# Patient Record
Sex: Female | Born: 1935 | Race: White | Hispanic: No | Smoking: Former smoker
Health system: Southern US, Community
[De-identification: ages and names within clinical notes are randomized; demographics above are authoritative.]

## PROBLEM LIST (undated history)

## (undated) DIAGNOSIS — I10 Essential (primary) hypertension: Secondary | ICD-10-CM

## (undated) DIAGNOSIS — G629 Polyneuropathy, unspecified: Secondary | ICD-10-CM

## (undated) DIAGNOSIS — M109 Gout, unspecified: Secondary | ICD-10-CM

## (undated) DIAGNOSIS — I739 Peripheral vascular disease, unspecified: Secondary | ICD-10-CM

## (undated) DIAGNOSIS — I441 Atrioventricular block, second degree: Secondary | ICD-10-CM

## (undated) DIAGNOSIS — M1990 Unspecified osteoarthritis, unspecified site: Secondary | ICD-10-CM

## (undated) DIAGNOSIS — I350 Nonrheumatic aortic (valve) stenosis: Secondary | ICD-10-CM

## (undated) DIAGNOSIS — I480 Paroxysmal atrial fibrillation: Secondary | ICD-10-CM

## (undated) DIAGNOSIS — E7800 Pure hypercholesterolemia, unspecified: Secondary | ICD-10-CM

## (undated) HISTORY — DX: Paroxysmal atrial fibrillation: I48.0

## (undated) HISTORY — PX: CATARACT EXTRACTION W/ INTRAOCULAR LENS  IMPLANT, BILATERAL: SHX1307

## (undated) HISTORY — DX: Polyneuropathy, unspecified: G62.9

## (undated) HISTORY — DX: Essential (primary) hypertension: I10

---

## 2011-02-01 ENCOUNTER — Encounter (HOSPITAL_COMMUNITY)
Admission: RE | Disposition: A | Discharge status: Home / Self Care | Payer: Self-pay | Source: Ambulatory Visit | Attending: Ophthalmology

## 2011-02-01 ENCOUNTER — Encounter (HOSPITAL_COMMUNITY): Payer: Self-pay | Admitting: *Deleted

## 2011-02-01 ENCOUNTER — Ambulatory Visit (HOSPITAL_COMMUNITY)
Admission: RE | Admit: 2011-02-01 | Discharge: 2011-02-01 | Disposition: A | Discharge status: Home / Self Care | Payer: Medicare PPO | Source: Ambulatory Visit | Attending: Ophthalmology | Admitting: Ophthalmology

## 2011-02-01 DIAGNOSIS — H26499 Other secondary cataract, unspecified eye: Secondary | ICD-10-CM | POA: Insufficient documentation

## 2011-02-01 DIAGNOSIS — E119 Type 2 diabetes mellitus without complications: Secondary | ICD-10-CM | POA: Insufficient documentation

## 2011-02-01 SURGERY — TREATMENT, USING YAG LASER
Anesthesia: LOCAL | Laterality: Left

## 2011-02-01 MED ORDER — TROPICAMIDE 1 % OP SOLN
1.0000 [drp] | OPHTHALMIC | Status: AC
  Administered 2011-02-01 (×2): 1 [drp] via OPHTHALMIC

## 2011-02-01 MED ORDER — TETRACAINE HCL 0.5 % OP SOLN
1.0000 [drp] | Freq: Once | OPHTHALMIC | Status: AC
  Administered 2011-02-01: 1 [drp] via OPHTHALMIC

## 2011-02-01 MED ORDER — APRACLONIDINE HCL 1 % OP SOLN
OPHTHALMIC | Status: AC
  Administered 2011-02-01: 1 [drp] via OPHTHALMIC
  Filled 2011-02-01: qty 0.1

## 2011-02-01 MED ORDER — TETRACAINE HCL 0.5 % OP SOLN
OPHTHALMIC | Status: AC
  Administered 2011-02-01: 1 [drp] via OPHTHALMIC
  Filled 2011-02-01: qty 2

## 2011-02-01 MED ORDER — TROPICAMIDE 1 % OP SOLN
OPHTHALMIC | Status: AC
  Administered 2011-02-01: 1 [drp] via OPHTHALMIC
  Filled 2011-02-01: qty 3

## 2011-02-01 MED ORDER — APRACLONIDINE HCL 1 % OP SOLN
1.0000 [drp] | OPHTHALMIC | Status: AC
  Administered 2011-02-01 (×2): 1 [drp] via OPHTHALMIC

## 2011-02-01 NOTE — H&P (Signed)
Pt interviewed and examined, diagnosis confirmed on slit lamp exam OS.

## 2011-02-01 NOTE — Progress Notes (Signed)
Ordered not to give 3rd drop per dr Lita Mains patients eye dilated enough.

## 2011-02-01 NOTE — Brief Op Note (Signed)
               Angela Mcdaniel 02/01/2011  [name]  Yag Laser Self Test Completedyes. Procedure: Posterior Capsulotomy, left eye.  Eye Protection Worn by Staff yes. Laser In Use Sign on Door yes.  Laser: Nd:YAG Spot Size: Fixed Burst Mode: III Power Setting: 2.o mJ/burst Position treated: n/a Number of shots: 28 Total energy delivered: 54.8 mJ    The patient tolerated the procedure without difficulty. No complications were encountered.  Tenometer reading immediately after procedure: 18 mmHg.  The patient was discharged home with the instructions to continue all her current glaucoma medications, if any.   Patient instructed to go to office at 11:15  09/25 for intraocular pressure check.  Patient verbalizes understanding of discharge instructions yes.  Nurses Notes:no complications; pt tolerated procedure well.

## 2011-02-28 HISTORY — PX: REFRACTIVE SURGERY: SHX103

## 2011-06-22 DIAGNOSIS — R011 Cardiac murmur, unspecified: Secondary | ICD-10-CM

## 2011-06-22 DIAGNOSIS — I498 Other specified cardiac arrhythmias: Secondary | ICD-10-CM

## 2011-06-24 DIAGNOSIS — R55 Syncope and collapse: Secondary | ICD-10-CM

## 2011-06-25 ENCOUNTER — Telehealth: Payer: Self-pay | Admitting: Cardiology

## 2011-06-25 NOTE — Telephone Encounter (Signed)
I was called by the monitoring company. She probably just had her monitor put on today. There was a report of a brief run of Mobitz type II. The patient was apparently asymptomatic and sleeping. I do not have the strips. This will be sent to her primary cardiologist.

## 2011-06-29 ENCOUNTER — Encounter: Payer: Self-pay | Admitting: *Deleted

## 2011-06-29 ENCOUNTER — Telehealth: Payer: Self-pay | Admitting: *Deleted

## 2011-06-29 NOTE — Telephone Encounter (Signed)
Spoke with patient to see how she is doing. Patient state's she feels fine. No c/o dizziness,sob or chest pain. Patient reminded of her appointment tomorrow morning at 10:30am.

## 2011-06-30 ENCOUNTER — Ambulatory Visit (INDEPENDENT_AMBULATORY_CARE_PROVIDER_SITE_OTHER): Payer: Medicare Other | Admitting: Cardiology

## 2011-06-30 ENCOUNTER — Telehealth: Payer: Self-pay | Admitting: *Deleted

## 2011-06-30 ENCOUNTER — Encounter: Payer: Self-pay | Admitting: Cardiology

## 2011-06-30 DIAGNOSIS — I359 Nonrheumatic aortic valve disorder, unspecified: Secondary | ICD-10-CM

## 2011-06-30 DIAGNOSIS — I498 Other specified cardiac arrhythmias: Secondary | ICD-10-CM

## 2011-06-30 DIAGNOSIS — I350 Nonrheumatic aortic (valve) stenosis: Secondary | ICD-10-CM

## 2011-06-30 DIAGNOSIS — I10 Essential (primary) hypertension: Secondary | ICD-10-CM

## 2011-06-30 DIAGNOSIS — R001 Bradycardia, unspecified: Secondary | ICD-10-CM | POA: Insufficient documentation

## 2011-06-30 DIAGNOSIS — I779 Disorder of arteries and arterioles, unspecified: Secondary | ICD-10-CM

## 2011-06-30 DIAGNOSIS — E119 Type 2 diabetes mellitus without complications: Secondary | ICD-10-CM

## 2011-06-30 DIAGNOSIS — IMO0001 Reserved for inherently not codable concepts without codable children: Secondary | ICD-10-CM

## 2011-06-30 NOTE — Telephone Encounter (Signed)
[facility]ive Admission to [facility] on 07/04/2011

## 2011-06-30 NOTE — Progress Notes (Signed)
HPI: Patient presents for post hospital followup, and to establish here in our clinic.  She was recently referred to [place] here at [facility] for evaluation of symptomatic bradycardia, most likely requiring pacemaker implantation. She was initially seen in consultation by Dr. Karren Burly, but then was transferred to our care, after she expressed a desire to be treated at [facility], if necessary. We carefully reviewed the baseline rhythm, which was notable for intermittent second degree AV type I and type II. However, we observed resolution of the arrhythmia the following day, after her nausea/vomiting resolved. We obtained a 2-D echo, which indicated normal LVF (EF 60-65%), with evidence of diastolic dysfunction, and mild aortic stenosis (mean gradient 7 mm of mercury). Carotid Dopplers were done, indicating 50-69% right ICA, and less than 50% left ICA stenosis. Of note, Dr. Andee Lineman suggested that patient might have carotid sinus hypersensitivity, by bedside testing. Orthostatic blood pressures showed no significant rise in systolic BP, but increase in pulse from 66-86, from supine to standing. TSH level was normal.  Patient presented with no known history of heart disease.  Following extensive consultation with the patient and family, plan was to observe closely as an outpatient with a continuous cardiac monitor, and early followup in our office. She presents today, as scheduled, for review of these results.  Patient has had numerous strips indicating occasional intermittent high-grade A-V block, with rates as low as 29 bpm, as well as a 3.1 second pause. The lowest rates were recorded from midafternoon to 11:00 at night. In all cases, no symptoms were reported.  Clinically, the patient denies any interim syncope or falls. She is under continuous observation by family, and presents with her daughter today. According to the daughter, the patient apparently spends most of her time sitting. Upon  standing, she does sometimes feel "wobbly", but denies any significant dizziness. Overall, she feels much better, than during her recent hospitalization. Her nausea has resolved, and she reportedly feels stronger each day. She denies chest pain, dyspnea, or palpitations.  No Known Allergies   Current Outpatient Prescriptions  Medication Sig Dispense Refill  . aspirin EC 81 MG tablet Take 81 mg by mouth daily as needed.      . fenofibrate (TRICOR) 145 MG tablet Take 145 mg by mouth daily.        . fish oil-omega-3 fatty acids 1000 MG capsule Take 2 g by mouth daily.       . insulin glargine (LANTUS) 100 UNIT/ML injection Inject 25 Units into the skin at bedtime.        . insulin lispro (HUMALOG) 100 UNIT/ML injection Inject 25 Units into the skin 3 (three) times daily before meals.        . metFORMIN (GLUCOPHAGE) 500 MG tablet Take 500 mg by mouth 2 (two) times daily with a meal.      . therapeutic multivitamin-minerals (THERAGRAN-M) tablet Take 1 tablet by mouth daily.        Marland Kitchen VITAMIN D, CHOLECALCIFEROL, PO Take 1 capsule by mouth daily.        Past Medical History  Diagnosis Date  . Type 2 diabetes mellitus   . Essential hypertension, benign   . Neuropathy     No past surgical history on file.  History   Social History  . Marital Status: Widowed    Spouse Name: N/A    Number of Children: N/A  . Years of Education: N/A   Occupational History  . Not on file.  Social History Main Topics  . Smoking status: Former Smoker -- 1.0 packs/day for 20 years    Types: Cigarettes    Quit date: 05/30/1988  . Smokeless tobacco: Never Used  . Alcohol Use: Not on file  . Drug Use: Not on file  . Sexually Active: Not on file   Other Topics Concern  . Not on file   Social History Narrative   Has 6 children whom are alive and 1 deceased.    Family History  Problem Relation Age of Onset  . Stroke Father   . Heart attack Mother   . Heart attack Brother     ROS: no nausea,  vomiting; no fever, chills; no melena, hematochezia; no claudication  PHYSICAL EXAM: BP 123/69  Pulse 67  Ht 5\' 5"  (1.651 m)  Wt 156 lb (70.761 kg)  BMI 25.96 kg/m2 GENERAL: 78 year old female, sitting upright; NAD HEENT: NCAT, PERRLA, EOMI; sclera clear; no xanthelasma NECK: palpable bilateral carotid pulses, soft bilateral bruits; no JVD; no TM LUNGS: CTA bilaterally CARDIAC: RRR (S1, S2); grade 2/6 holosystolic murmur; no rubs or gallops ABDOMEN: soft, non-tender; intact BS EXTREMETIES: no significant peripheral edema SKIN: warm/dry; no obvious rash/lesions MUSCULOSKELETAL: no joint deformity NEURO: no focal deficit; NL affect   EKG:  ASSESSMENT & PLAN:

## 2011-06-30 NOTE — Assessment & Plan Note (Addendum)
Recommendation was to admit the patient today to [facility], so as to proceed with possible permanent pacemaker implantation. However, the patient declined to do so, but has agreed to presented electively on Monday, February 4th, at [facility]. The patient was counseled regarding the risks of not being admitted today, by Dr. Andee Lineman, but nevertheless declined his recommended medical advice. Therefore, we will arrange for elective admission on February 4th, at which time she will be evaluated by our electrophysiology team for possible permanent pacemaker implantation. The patient will be maintained on current medication regimen, and is not on any rate slowing agents. CardioNet monitor will be discontinued, as of today.

## 2011-06-30 NOTE — Assessment & Plan Note (Signed)
Continue current  diabetes medication regimen, including insulin.

## 2011-06-30 NOTE — Patient Instructions (Signed)
   Admission on Monday, February 4 to [facility], someone from patient placement will be calling that morning to let you know when to arrive

## 2011-06-30 NOTE — Assessment & Plan Note (Signed)
Stable on current medication regimen 

## 2011-07-01 ENCOUNTER — Other Ambulatory Visit: Payer: Self-pay | Admitting: Physician Assistant

## 2011-07-01 DIAGNOSIS — R001 Bradycardia, unspecified: Secondary | ICD-10-CM

## 2011-07-03 NOTE — Progress Notes (Signed)
I discussed with patient need for PPM implantation. I told her we would admit her , but she wanted to wait until Monday.I  told her this was against my advice, nevertheless the patient wanted only to be admitted Monday. This conversation was witnessed by her daughter. I asked the patient to sign a form she declined acute admission

## 2011-07-04 ENCOUNTER — Inpatient Hospital Stay (HOSPITAL_COMMUNITY)
Admission: AD | Admit: 2011-07-04 | Discharge: 2011-07-06 | DRG: 244 | Disposition: A | Discharge status: Home / Self Care | Payer: Medicare Other | Source: Ambulatory Visit | Attending: Cardiology | Admitting: Cardiology

## 2011-07-04 ENCOUNTER — Encounter (HOSPITAL_COMMUNITY): Payer: Self-pay | Admitting: General Practice

## 2011-07-04 DIAGNOSIS — I6529 Occlusion and stenosis of unspecified carotid artery: Secondary | ICD-10-CM | POA: Diagnosis present

## 2011-07-04 DIAGNOSIS — Z87891 Personal history of nicotine dependence: Secondary | ICD-10-CM

## 2011-07-04 DIAGNOSIS — I498 Other specified cardiac arrhythmias: Secondary | ICD-10-CM | POA: Diagnosis present

## 2011-07-04 DIAGNOSIS — R001 Bradycardia, unspecified: Secondary | ICD-10-CM

## 2011-07-04 DIAGNOSIS — I359 Nonrheumatic aortic valve disorder, unspecified: Secondary | ICD-10-CM | POA: Diagnosis present

## 2011-07-04 DIAGNOSIS — I658 Occlusion and stenosis of other precerebral arteries: Secondary | ICD-10-CM | POA: Diagnosis present

## 2011-07-04 DIAGNOSIS — Z794 Long term (current) use of insulin: Secondary | ICD-10-CM

## 2011-07-04 DIAGNOSIS — Z7982 Long term (current) use of aspirin: Secondary | ICD-10-CM

## 2011-07-04 DIAGNOSIS — E7800 Pure hypercholesterolemia, unspecified: Secondary | ICD-10-CM | POA: Diagnosis present

## 2011-07-04 DIAGNOSIS — I441 Atrioventricular block, second degree: Principal | ICD-10-CM | POA: Diagnosis present

## 2011-07-04 DIAGNOSIS — Z8249 Family history of ischemic heart disease and other diseases of the circulatory system: Secondary | ICD-10-CM

## 2011-07-04 DIAGNOSIS — Z823 Family history of stroke: Secondary | ICD-10-CM

## 2011-07-04 DIAGNOSIS — E119 Type 2 diabetes mellitus without complications: Secondary | ICD-10-CM | POA: Diagnosis present

## 2011-07-04 DIAGNOSIS — Z79899 Other long term (current) drug therapy: Secondary | ICD-10-CM

## 2011-07-04 DIAGNOSIS — I10 Essential (primary) hypertension: Secondary | ICD-10-CM | POA: Diagnosis present

## 2011-07-04 HISTORY — DX: Peripheral vascular disease, unspecified: I73.9

## 2011-07-04 HISTORY — DX: Pure hypercholesterolemia, unspecified: E78.00

## 2011-07-04 HISTORY — DX: Unspecified osteoarthritis, unspecified site: M19.90

## 2011-07-04 HISTORY — DX: Atrioventricular block, second degree: I44.1

## 2011-07-04 HISTORY — DX: Nonrheumatic aortic (valve) stenosis: I35.0

## 2011-07-04 HISTORY — DX: Gout, unspecified: M10.9

## 2011-07-04 LAB — CBC
Hemoglobin: 11.8 g/dL — ABNORMAL LOW (ref 12.0–15.0)
MCH: 29.5 pg (ref 26.0–34.0)
MCHC: 33.1 g/dL (ref 30.0–36.0)
Platelets: 232 10*3/uL (ref 150–400)
RDW: 13.4 % (ref 11.5–15.5)

## 2011-07-04 LAB — COMPREHENSIVE METABOLIC PANEL
ALT: 15 U/L (ref 0–35)
AST: 20 U/L (ref 0–37)
Albumin: 3.5 g/dL (ref 3.5–5.2)
Alkaline Phosphatase: 62 U/L (ref 39–117)
Calcium: 9.9 mg/dL (ref 8.4–10.5)
GFR calc Af Amer: 48 mL/min — ABNORMAL LOW (ref >90)
Glucose, Bld: 95 mg/dL (ref 70–99)
Potassium: 4 mEq/L (ref 3.5–5.1)
Sodium: 138 mEq/L (ref 135–145)
Total Protein: 6.7 g/dL (ref 6.0–8.3)

## 2011-07-04 LAB — DIFFERENTIAL
Eosinophils Absolute: 0.2 10*3/uL (ref 0.0–0.7)
Eosinophils Relative: 2 % (ref 0–5)
Lymphocytes Relative: 32 % (ref 12–46)
Lymphs Abs: 2.5 10*3/uL (ref 0.7–4.0)
Monocytes Relative: 7 % (ref 3–12)

## 2011-07-04 LAB — GLUCOSE, CAPILLARY: Glucose-Capillary: 93 mg/dL (ref 70–99)

## 2011-07-04 MED ORDER — SODIUM CHLORIDE 0.9 % IV SOLN: 250.0000 mL | INTRAVENOUS | Status: DC | PRN

## 2011-07-04 MED ORDER — SODIUM CHLORIDE 0.45 % IV SOLN: INTRAVENOUS | Status: DC

## 2011-07-04 MED ORDER — SODIUM CHLORIDE 0.9 % IJ SOLN: 3.0000 mL | INTRAMUSCULAR | Status: DC | PRN

## 2011-07-04 MED ORDER — ACETAMINOPHEN 650 MG RE SUPP: 650.0000 mg | Freq: Four times a day (QID) | RECTAL | Status: DC | PRN

## 2011-07-04 MED ORDER — SODIUM CHLORIDE 0.9 % IJ SOLN
3.0000 mL | Freq: Two times a day (BID) | INTRAMUSCULAR | Status: DC
  Administered 2011-07-04: 3 mL via INTRAVENOUS

## 2011-07-04 MED ORDER — GUAIFENESIN-DM 100-10 MG/5ML PO SYRP: 15.0000 mL | ORAL_SOLUTION | ORAL | Status: DC | PRN

## 2011-07-04 MED ORDER — CHLORHEXIDINE GLUCONATE 4 % EX LIQD
60.0000 mL | Freq: Once | CUTANEOUS | Status: DC
  Filled 2011-07-04: qty 60

## 2011-07-04 MED ORDER — CHLORHEXIDINE GLUCONATE 4 % EX LIQD
60.0000 mL | Freq: Once | CUTANEOUS | Status: AC
  Administered 2011-07-04: 4 via TOPICAL
  Filled 2011-07-04: qty 60

## 2011-07-04 MED ORDER — ALUM & MAG HYDROXIDE-SIMETH 200-200-20 MG/5ML PO SUSP: 15.0000 mL | ORAL | Status: DC | PRN

## 2011-07-04 MED ORDER — SODIUM CHLORIDE 0.9 % IV SOLN
INTRAVENOUS | Status: DC
  Administered 2011-07-04 – 2011-07-05 (×2): via INTRAVENOUS

## 2011-07-04 MED ORDER — CEFAZOLIN SODIUM 1-5 GM-% IV SOLN
1.0000 g | INTRAVENOUS | Status: DC
  Filled 2011-07-04: qty 50

## 2011-07-04 MED ORDER — SODIUM CHLORIDE 0.9 % IJ SOLN: 3.0000 mL | Freq: Two times a day (BID) | INTRAMUSCULAR | Status: DC

## 2011-07-04 MED ORDER — SODIUM CHLORIDE 0.9 % IR SOLN
80.0000 mg | Status: DC
  Filled 2011-07-04 (×2): qty 2

## 2011-07-04 MED ORDER — LOPERAMIDE HCL 2 MG PO CAPS: 4.0000 mg | ORAL_CAPSULE | Freq: Once | ORAL | Status: AC | PRN

## 2011-07-04 MED ORDER — ENOXAPARIN SODIUM 40 MG/0.4ML ~~LOC~~ SOLN
40.0000 mg | SUBCUTANEOUS | Status: DC
  Filled 2011-07-04 (×2): qty 0.4

## 2011-07-04 MED ORDER — ACETAMINOPHEN 325 MG PO TABS: 650.0000 mg | ORAL_TABLET | Freq: Four times a day (QID) | ORAL | Status: DC | PRN

## 2011-07-05 ENCOUNTER — Encounter (HOSPITAL_COMMUNITY)
Admission: AD | Disposition: A | Discharge status: Home / Self Care | Payer: Self-pay | Source: Ambulatory Visit | Attending: Cardiology

## 2011-07-05 DIAGNOSIS — I441 Atrioventricular block, second degree: Secondary | ICD-10-CM

## 2011-07-05 HISTORY — PX: PACEMAKER INSERTION: SHX728

## 2011-07-05 HISTORY — PX: PERMANENT PACEMAKER INSERTION: SHX5480

## 2011-07-05 LAB — GLUCOSE, CAPILLARY
Glucose-Capillary: 232 mg/dL — ABNORMAL HIGH (ref 70–99)
Glucose-Capillary: 87 mg/dL (ref 70–99)

## 2011-07-05 SURGERY — PERMANENT PACEMAKER INSERTION
Anesthesia: LOCAL

## 2011-07-05 MED ORDER — FENTANYL CITRATE 0.05 MG/ML IJ SOLN
INTRAMUSCULAR | Status: AC
  Filled 2011-07-05: qty 2

## 2011-07-05 MED ORDER — MIDAZOLAM HCL 5 MG/5ML IJ SOLN
INTRAMUSCULAR | Status: AC
  Filled 2011-07-05: qty 5

## 2011-07-05 MED ORDER — ASPIRIN EC 81 MG PO TBEC
81.0000 mg | DELAYED_RELEASE_TABLET | Freq: Every day | ORAL | Status: DC
  Administered 2011-07-05 – 2011-07-06 (×2): 81 mg via ORAL
  Filled 2011-07-05 (×2): qty 1

## 2011-07-05 MED ORDER — LIDOCAINE HCL (PF) 1 % IJ SOLN
INTRAMUSCULAR | Status: AC
  Filled 2011-07-05: qty 60

## 2011-07-05 MED ORDER — INSULIN ASPART 100 UNIT/ML ~~LOC~~ SOLN
25.0000 [IU] | Freq: Three times a day (TID) | SUBCUTANEOUS | Status: DC
  Administered 2011-07-05 – 2011-07-06 (×3): 25 [IU] via SUBCUTANEOUS
  Filled 2011-07-05: qty 3

## 2011-07-05 MED ORDER — HEPARIN (PORCINE) IN NACL 2-0.9 UNIT/ML-% IJ SOLN
INTRAMUSCULAR | Status: AC
  Filled 2011-07-05: qty 1000

## 2011-07-05 MED ORDER — ACETAMINOPHEN 325 MG PO TABS
325.0000 mg | ORAL_TABLET | ORAL | Status: DC | PRN
  Administered 2011-07-05: 650 mg via ORAL
  Filled 2011-07-05: qty 2

## 2011-07-05 MED ORDER — INSULIN LISPRO 100 UNIT/ML ~~LOC~~ SOLN: 25.0000 [IU] | Freq: Three times a day (TID) | SUBCUTANEOUS | Status: DC

## 2011-07-05 MED ORDER — CEFAZOLIN SODIUM 1-5 GM-% IV SOLN
1.0000 g | Freq: Four times a day (QID) | INTRAVENOUS | Status: AC
  Administered 2011-07-05 – 2011-07-06 (×3): 1 g via INTRAVENOUS
  Filled 2011-07-05 (×3): qty 50

## 2011-07-05 MED ORDER — ONDANSETRON HCL 4 MG/2ML IJ SOLN: 4.0000 mg | Freq: Four times a day (QID) | INTRAMUSCULAR | Status: DC | PRN

## 2011-07-05 MED ORDER — INSULIN GLARGINE 100 UNIT/ML ~~LOC~~ SOLN
25.0000 [IU] | Freq: Every day | SUBCUTANEOUS | Status: DC
  Administered 2011-07-05: 25 [IU] via SUBCUTANEOUS
  Filled 2011-07-05: qty 3

## 2011-07-05 NOTE — Consult Note (Signed)
ELECTROPHYSIOLOGY CONSULT NOTE  Primary Care Physician: Toma Deiters, MD, MD Referring Physician:  Dr Andee Lineman  Admit Date: 07/04/2011  Reason for consultation:  Second degree AV block  Angela Mcdaniel is a 78 y.o. female with a h/o diabetes and preserved EF who is admitted from our [facility] office for further evaluation of second degree AV block.  She was recently admitted to [facility] hospital at which time she was observed to have second degree AV block.  Upon discharge, she had an event monitor placed.  This has documented mobitz II second degree AV block.  She was therefore admitted for further evaluation.   She states that recently, she has had episodes during which she felt "woozy".  She denies frank syncope but reports presyncope.  She takes no AV nodal blocking agents.  She remains active despite her age.  Today, she denies symptoms of palpitations, chest pain, shortness of breath, lower extremity edema,or neurologic sequela. The patient is tolerating medications without difficulties and is otherwise without complaint today.   Past Medical History  Diagnosis Date  . Essential hypertension, benign   . Neuropathy   . Diabetes mellitus   . High cholesterol   . Peripheral vascular disease   . Arthritis   . Gout attack     "once; several years ago"   Past Surgical History  Procedure Date  . Cataract extraction w/ intraocular lens  implant, bilateral   . Refractive surgery 02/2011    left eye       .  ceFAZolin (ANCEF) IV  1 g Intravenous On Call  . chlorhexidine  60 mL Topical Once  . chlorhexidine  60 mL Topical Once  . gentamicin irrigation  80 mg Irrigation On Call  . heparin      . lidocaine      . sodium chloride  3 mL Intravenous Q12H  . sodium chloride  3 mL Intravenous Q12H  . DISCONTD: enoxaparin  40 mg Subcutaneous Q24H      . sodium chloride 50 mL/hr at 07/05/11 7829  . DISCONTD: sodium chloride      No Known Allergies  History   Social History  . Marital  Status: Widowed    Spouse Name: N/A    Number of Children: N/A  . Years of Education: N/A   Occupational History  . Not on file.   Social History Main Topics  . Smoking status: Former Smoker -- 1.0 packs/day for 20 years    Types: Cigarettes    Quit date: 05/30/1988  . Smokeless tobacco: Never Used  . Alcohol Use: No  . Drug Use: No  . Sexually Active: No   Other Topics Concern  . Not on file   Social History Narrative   Has 6 children whom are alive and 1 deceased.    Family History  Problem Relation Age of Onset  . Stroke Father   . Heart attack Mother   . Heart attack Brother     ROS- All systems are reviewed and negative except as per the HPI above  Physical Exam: Telemetry:  Sinus rhythm with occasional mobitz II second degree AV block,  Multiple consecutive P waves not conducted with RR interval of over 3 seconds on one occasion  Filed Vitals:   07/04/11 1525 07/04/11 1748 07/04/11 2136 07/05/11 0635  BP:  159/74 135/56 127/50  Pulse: 72 86 73 71  Temp: 98.2 F (36.8 C)  98.8 F (37.1 C) 98.7 F (37.1 C)  TempSrc: Oral  Oral Oral  Resp: 18 18 18 18   Height: 5\' 5"  (1.651 m)     Weight: 160 lb 0.9 oz (72.6 kg)     SpO2: 96% 98% 95% 96%    GEN- The patient is well appearing, alert and oriented x 3 today.   Head- normocephalic, atraumatic Eyes-  Sclera clear, conjunctiva pink Ears- hearing intact Oropharynx- clear Neck- supple, no JVP Lymph- no cervical lymphadenopathy Lungs- Clear to ausculation bilaterally, normal work of breathing Heart- Regular rate and rhythm, no murmurs, rubs or gallops, PMI not laterally displaced GI- soft, NT, ND, + BS Extremities- no clubbing, cyanosis, or edema MS- no significant deformity or atrophy Skin- no rash or lesion Psych- euthymic mood, full affect Neuro- strength and sensation are intact  EKG 06/21/11- sinus rhythm 62 bpm, PR 213, QRS 94, no ischemic changes  Labs:   Lab Results  Component Value Date   WBC  7.6 07/04/2011   HGB 11.8* 07/04/2011   HCT 35.6* 07/04/2011   MCV 89.0 07/04/2011   PLT 232 07/04/2011    Lab 07/04/11 1626  NA 138  K 4.0  CL 105  CO2 25  BUN 21  CREATININE 1.16*  CALCIUM 9.9  PROT 6.7  BILITOT 0.2*  ALKPHOS 62  ALT 15  AST 20  GLUCOSE 95    ASSESSMENT AND PLAN:   1. Mobitz II second degree AV block The patient has symptomatic mobitz II second degree.  I would therefore recommend pacemaker implantation at this time.  Risks, benefits, alternatives to pacemaker implantation were discussed in detail with the patient today. The patient understands that the risks include but are not limited to bleeding, infection, pneumothorax, perforation, tamponade, vascular damage, renal failure, MI, stroke, death,  and lead dislodgement and wishes to proceed. We will therefore proceed with pacemaker implant this am.    Hillis Range, MD 07/05/2011  7:38 AM

## 2011-07-05 NOTE — Op Note (Signed)
SURGEON:  Hillis Range, MD     PREPROCEDURE DIAGNOSIS:  Mobitz II Second degree AV block    POSTPROCEDURE DIAGNOSIS:  Mobitz II Second degree AV block     PROCEDURES:   1. Left upper extremity venography.   2. Pacemaker implantation.     INTRODUCTION: Angela Mcdaniel is a 78 y.o. female  with a history of mobitz II second degree AV block who presents today for pacemaker implantation.  The patient reports intermittent episodes of dizziness over the past few weeks.  No reversible causes have been identified.  The patient therefore presents today for pacemaker implantation.     DESCRIPTION OF PROCEDURE:  Informed written consent was obtained, and the patient was brought to the electrophysiology lab in a fasting state.  The patient required no sedation for the procedure today.  The patients left chest was prepped and draped in the usual sterile fashion by the EP lab staff. The skin overlying the left deltopectoral region was infiltrated with lidocaine for local analgesia.  A 4-cm incision was made over the left deltopectoral region.  A left subcutaneous pacemaker pocket was fashioned using a combination of sharp and blunt dissection. Electrocautery was required to assure hemostasis.    Left Upper Extremity Venography: A venogram of the left upper extremity was performed, which revealed a small left cephalic vein, which emptied into a moderate left subclavian vein.  The left axillary vein was moderate in size.    RA/RV Lead Placement: The left cephalic vein was directly visualized and was too small for cannulation.  Through the left axillary vein, a [facility] model [id number] (serial number  [id number]) right atrial lead and a [facility] model 1946- [birth year] (serial number [id number]) right ventricular lead were advanced with fluoroscopic visualization into the right atrial appendage and right ventricular apex positions respectively.  Initial atrial lead P- waves measured 5 mV with impedance of 515 ohms  and a threshold of 1.4 V at 0.5 msec.  Right ventricular lead R-waves measured 9.5 mV with an impedance of 828 ohms and a threshold of 0.5 V at 0.5 msec.  Both leads were secured to the pectoralis fascia using #2-0 silk over the suture sleeves.   Device Placement:  The leads were then connected to a [employer] DR RF model [id number] (serial number  [id number] ) pacemaker.  The pocket was irrigated with copious gentamicin solution.  The pacemaker was then placed into the pocket.  The pocket was then closed in 2 layers with 2.0 Vicryl suture for the subcutaneous and subcuticular layers.  Steri-   Strips and a sterile dressing were then applied.  There were no early apparent complications.     CONCLUSIONS:   1. Successful implantation of a Industrial/product designer DR RF dual-chamber pacemaker for symptomatic mobitz II second degree AV block  2. No early apparent complications.           Hillis Range, MD 07/05/2011 9:59 AM

## 2011-07-05 NOTE — Telephone Encounter (Signed)
Notification done.

## 2011-07-06 ENCOUNTER — Inpatient Hospital Stay (HOSPITAL_COMMUNITY): Payer: Medicare Other

## 2011-07-06 ENCOUNTER — Encounter (HOSPITAL_COMMUNITY): Payer: Self-pay | Admitting: Physician Assistant

## 2011-07-06 ENCOUNTER — Other Ambulatory Visit: Payer: Self-pay

## 2011-07-06 DIAGNOSIS — I441 Atrioventricular block, second degree: Secondary | ICD-10-CM

## 2011-07-06 LAB — GLUCOSE, CAPILLARY

## 2011-07-06 NOTE — Progress Notes (Signed)
   CARE MANAGEMENT NOTE 07/06/2011  Patient:  ARABIA, NYLUND   Account Number:  [id number]  Date Initiated:  07/06/2011  Documentation initiated by:  GRAVES-BIGELOW,Shermika Balthaser  Subjective/Objective Assessment:   Pt admitted  for  Left upper extremity venography.2. Pacemaker implantation. Pt originally home with son will transition to daughters house  after d/c then transition back to sons home.     Action/Plan:   CM spoke to pt and son and pt ambulates well with cane refusing HH services at this time. CM relayed information to CC and pt plan for d/c today no services.   Anticipated DC Date:  07/06/2011   Anticipated DC Plan:  HOME/SELF CARE      DC Planning Services  CM consult      Choice offered to / List presented to:             Status of service:  Completed, signed off Medicare Important Message given?   (If response is "NO", the following Medicare IM given date fields will be blank) Date Medicare IM given:   Date Additional Medicare IM given:    Discharge Disposition:  HOME/SELF CARE  Per UR Regulation:    Comments:  07-06-11 [address], RN,BSN [phone] No other needs identified by CM at this time.

## 2011-07-06 NOTE — Discharge Summary (Signed)
Discharge Summary   Patient ID: Angela Mcdaniel MRN: [id number], DOB/AGE: January 01, 1922 78 y.o. Admit date: 07/04/2011 D/C date:     07/06/2011   Primary Discharge Diagnoses:  1. Symptomatic Mobitz II Second Degree AV Block s/p St. Jude PPM 07/05/11  Secondary Discharge Diagnoses:  1. DM 2. HTN 3. Neuropathy 4. PVD by carotid dopplers recently with 50-69% right ICA, and less than 50% left ICA stenosis 5. Mild aortic stenosis by echo recently 6. Arthritis  Hospital Course: 78 y/o F with hx of DM, HTN recently seen in the office 06/30/11 for evaluation of symptomatic bradycardia. Strips were reviewed which were notable for second degree AV type I and type II. She had had some nausea/vomiting so was observed after this had resolved. 2D echo showed normal LVF (EF 60-65%), with evidence of diastolic dysfunction, and mild aortic stenosis (mean gradient 7 mm of mercury). Carotid Dopplers were done, indicating 50-69% right ICA, and less than 50% left ICA stenosis. Of note, Dr. Andee Lineman suggested that patient might have carotid sinus hypersensitivity, by bedside testing. Orthostatic VS were negative for blood pressure change but positive with regard to HR increase from 66-86. An event monitor was placed demonstrating occasional intermittent high-grade A-V block, with rates as low as 29 bpm, as well as a 3.1 second pause. The lowest rates were recorded from midafternoon to 11:00 at night. In all cases, no symptoms were reported. She did endorse feeling wobbly upon standing at times as well as intermittently "woozy." She was on no AV nodal agents. TSH level was normal. The recommendation was to admit her from the office to [facility] with plans for ppm. However, the patient declined to do so and was counseled about the risks. She preferred elective pacemaker placement and was brought in 07/04/11 for this procedure. Telemetry was reviewed showing sinus rhythm with occasional mobitz II second degree AV block, multiple  consecutive P waves not conducted with RR interval of over 3 seconds on one occasion. She underwent implantation of a Industrial/product designer DR RF dual-chamber pacemaker for symptomatic mobitz II second degree AV block. She did well post-procedurally. F/u CXR was without complication. The family and patient respectfully declined PT eval to assess her mobility at home, stating that she has 24-hour care. In regards to diabetes, blood sugars were mostlywell controlled this admission without Metformin. Her CrCl is decreased in light of her age, so we have advised to continue to hold Metformin and talk to PCP regarding this. The patient was seen and examined today and felt stable for discharge by Dr. Johney Frame.  Discharge Vitals: Blood pressure 148/68, pulse 68, temperature 98.4 F (36.9 C), temperature source Oral, resp. rate 18, height 5\' 5"  (1.651 m), weight 160 lb 0.9 oz (72.6 kg), SpO2 93.00%.  Labs: Lab Results  Component Value Date   WBC 7.6 07/04/2011   HGB 11.8* 07/04/2011   HCT 35.6* 07/04/2011   MCV 89.0 07/04/2011   PLT 232 07/04/2011    Lab 07/04/11 1626  NA 138  K 4.0  CL 105  CO2 25  BUN 21  CREATININE 1.16*  CALCIUM 9.9  PROT 6.7  BILITOT 0.2*  ALKPHOS 62  ALT 15  AST 20  GLUCOSE 95    Diagnostic Studies/Procedures  1. Chest 2 View 07/06/2011  *RADIOLOGY REPORT*  IMPRESSION: No acute abnormalities.  Dual lead pacer in place.  Original Report Authenticated By: Gwynn Burly, M.D.    Discharge Medications   Medication List  As  of 07/06/2011 11:37 AM   STOP taking these medications         metFORMIN 500 MG tablet         TAKE these medications         aspirin EC 81 MG tablet   Take 81 mg by mouth daily.      fenofibrate 145 MG tablet   Commonly known as: TRICOR   Take 145 mg by mouth daily.      fish oil-omega-3 fatty acids 1000 MG capsule   Take 2 g by mouth daily.      insulin glargine 100 UNIT/ML injection   Commonly known as: LANTUS   Inject 25 Units into the  skin at bedtime.      insulin lispro 100 UNIT/ML injection   Commonly known as: HUMALOG   Inject 25 Units into the skin 3 (three) times daily before meals.      therapeutic multivitamin-minerals tablet   Take 1 tablet by mouth daily.      VITAMIN D (CHOLECALCIFEROL) PO   Take 1 capsule by mouth daily.            Disposition   The patient will be discharged in stable condition to home. Discharge Orders    Future Appointments: Provider: Department: Dept Phone: Center:   07/18/2011 2:30 PM Vella Kohler [facility] [phone] [facility]     Future Orders Please Complete By Expires   Diet - low sodium heart healthy      Increase activity slowly      Comments:   Please see attached sheet for instructions on wound care, activity, and bathing.     Discharge instructions      Comments:   Please follow up with primary care doctor for your diabetes. While on insulin only here, your blood sugars were fairly well controlled. We have stopped your Metformin since it is less preferred in patients with kidney insufficiency (your kidney function was slightly reduced this admission as a result of your age).     Follow-up Information    Follow up with [facility]. (07/18/11 at 2:30pm for wound check)    Contact information:   [facility] OFFICE [address] [place] [address]       Follow up with Hillis Range, MD. (Our office will call you with an appointment)    Contact information:   [place] OFF[facility]  [phone]           Duration of Discharge Encounter: Greater than 30 minutes including physician and PA time.  Signed, Ronie Spies PA-C 07/06/2011, 11:37 AM      I have seen, examined the patient, and reviewed the above assessment and plan.   Co Sign: Hillis Range, MD 07/06/2011 10:20 PM

## 2011-07-06 NOTE — Progress Notes (Addendum)
SUBJECTIVE: The patient is doing well today s/p PPM.  At this time, she denies chest pain, shortness of breath, or any new concerns.     Marland Kitchen aspirin EC  81 mg Oral Daily  .  ceFAZolin (ANCEF) IV  1 g Intravenous Q6H  . insulin aspart  25 Units Subcutaneous TID WC  . insulin glargine  25 Units Subcutaneous QHS  . DISCONTD:  ceFAZolin (ANCEF) IV  1 g Intravenous On Call  . DISCONTD: chlorhexidine  60 mL Topical Once  . DISCONTD: gentamicin irrigation  80 mg Irrigation On Call  . DISCONTD: insulin lispro  25 Units Subcutaneous TID AC  . DISCONTD: sodium chloride  3 mL Intravenous Q12H  . DISCONTD: sodium chloride  3 mL Intravenous Q12H      . DISCONTD: sodium chloride 50 mL/hr at 07/05/11 0633    OBJECTIVE: Physical Exam: Filed Vitals:   07/05/11 1300 07/05/11 1400 07/05/11 2238 07/06/11 0414  BP: 117/37 104/63 140/66 148/68  Pulse: 80 85 76 68  Temp:   97.7 F (36.5 C) 98.4 F (36.9 C)  TempSrc:   Oral Oral  Resp:   18 18  Height:      Weight:      SpO2:   94% 93%    Intake/Output Summary (Last 24 hours) at 07/06/11 0750 Last data filed at 07/05/11 2312  Gross per 24 hour  Intake    360 ml  Output    500 ml  Net   -140 ml    Telemetry reveals sinus rhythm  GEN- The patient is well appearing, alert and oriented x 3 today.   Head- normocephalic, atraumatic Eyes-  Sclera clear, conjunctiva pink Ears- hearing intact Oropharynx- clear Neck- supple, no JVP Lymph- no cervical lymphadenopathy Lungs- Clear to ausculation bilaterally, normal work of breathing Heart- Regular rate and rhythm, no murmurs, rubs or gallops, PMI not laterally displaced GI- soft, NT, ND, + BS Extremities- no clubbing, cyanosis, or edema Pacer site without hematoma  LABS: Basic Metabolic Panel:  Basename 07/04/11 1626  NA 138  K 4.0  CL 105  CO2 25  GLUCOSE 95  BUN 21  CREATININE 1.16*  CALCIUM 9.9  MG --  PHOS --   Liver Function Tests:  Basename 07/04/11 1626  AST 20  ALT 15   ALKPHOS 62  BILITOT 0.2*  PROT 6.7  ALBUMIN 3.5   No results found for this basename: LIPASE:2,AMYLASE:2 in the last 72 hours CBC:  Basename 07/04/11 1626  WBC 7.6  NEUTROABS 4.4  HGB 11.8*  HCT 35.6*  MCV 89.0  PLT 232   ASSESSMENT AND PLAN:  Active Problems:  Symptomatic bradycardia  Second degree Mobitz II AV block  1. Mobitz II second degree AV block Doing well s/p PPM CXR without pneumothorax, stable lead position  PPM interrogation reviewed and normal today  Will discharge to home today Resume home medications Routine wound care 10 days will need a wound check, follow-up with me in the [place] office in 3 months    Hillis Range, MD 07/06/2011 7:50 AM

## 2011-07-06 NOTE — Progress Notes (Signed)
PT eval was ordered. Pt and son declined eval, stating pt has 24/7 supervision at this time. No other needs identified. Pt to be discharged today. Harlow Asa

## 2011-07-07 ENCOUNTER — Encounter: Payer: Self-pay | Admitting: Cardiology

## 2011-07-18 ENCOUNTER — Ambulatory Visit (INDEPENDENT_AMBULATORY_CARE_PROVIDER_SITE_OTHER): Payer: Medicare Other | Admitting: *Deleted

## 2011-07-18 ENCOUNTER — Encounter: Payer: Self-pay | Admitting: Internal Medicine

## 2011-07-18 DIAGNOSIS — R001 Bradycardia, unspecified: Secondary | ICD-10-CM

## 2011-07-18 DIAGNOSIS — I441 Atrioventricular block, second degree: Secondary | ICD-10-CM

## 2011-07-18 DIAGNOSIS — I498 Other specified cardiac arrhythmias: Secondary | ICD-10-CM

## 2011-07-18 LAB — PACEMAKER DEVICE OBSERVATION
AL IMPEDENCE PM: 387.5 Ohm
AL THRESHOLD: 0.625 V
BAMS-0003: 70 {beats}/min
BATTERY VOLTAGE: 3.0832 V

## 2011-07-18 NOTE — Progress Notes (Signed)
Wound check pacer in clinic  

## 2011-11-02 ENCOUNTER — Ambulatory Visit (INDEPENDENT_AMBULATORY_CARE_PROVIDER_SITE_OTHER): Payer: Medicare Other | Admitting: Internal Medicine

## 2011-11-02 ENCOUNTER — Encounter: Payer: Self-pay | Admitting: Internal Medicine

## 2011-11-02 VITALS — BP 119/56 | HR 70 | Ht 65.0 in | Wt 162.0 lb

## 2011-11-02 DIAGNOSIS — I480 Paroxysmal atrial fibrillation: Secondary | ICD-10-CM | POA: Insufficient documentation

## 2011-11-02 DIAGNOSIS — I4891 Unspecified atrial fibrillation: Secondary | ICD-10-CM

## 2011-11-02 DIAGNOSIS — I441 Atrioventricular block, second degree: Secondary | ICD-10-CM

## 2011-11-02 LAB — PACEMAKER DEVICE OBSERVATION
AL AMPLITUDE: 2.6 mv
AL IMPEDENCE PM: 375 Ohm
AL THRESHOLD: 0.5 V
BAMS-0003: 70 {beats}/min
RV LEAD AMPLITUDE: 12 mv

## 2011-11-02 NOTE — Patient Instructions (Signed)
Follow up with Dr. Johney Frame in February 2014. Your physician recommends that you continue on your current medications as directed. Please refer to the Current Medication list given to you today.

## 2011-11-02 NOTE — Progress Notes (Signed)
PCP: Toma Deiters, MD, MD Primary Cardiologist:  Dr Kandis Nab is a 78 y.o. female who presents today for routine electrophysiology followup.  Since having her pacemaker implanted, the patient reports doing very well.   Her dizziness and presyncope have resolved.  Her daughter notes that her energy is also improved.  Her primary concern today is with peripheral neuropathy and unsteadiness with ambulation.   Today, she denies symptoms of palpitations, chest pain, shortness of breath,  lower extremity edema, dizziness, presyncope, or syncope.  The patient is otherwise without complaint today.   Past Medical History  Diagnosis Date  . Essential hypertension, benign   . Neuropathy   . Diabetes mellitus   . High cholesterol   . Peripheral vascular disease     by carotid dopplers recently with 50-69% right ICA, and less than 50% left ICA stenosis  . Arthritis   . Gout attack     "once; several years ago"  . Aortic stenosis     Mild aortic stenosis by echo recently  . Second degree AV block, Mobitz type II     Symptomatic s/p St. Jude PPM 07/05/11  . Paroxysmal atrial fibrillation     detected by pacemaker interrogation, longest episode is only 24 seconds   Past Surgical History  Procedure Date  . Cataract extraction w/ intraocular lens  implant, bilateral   . Refractive surgery 02/2011    left eye  . Pacemaker insertion 07/05/11    SJM Accent DR RF pacemaker implanted by Dr Johney Frame    Current Outpatient Prescriptions  Medication Sig Dispense Refill  . aspirin EC 81 MG tablet Take 81 mg by mouth daily.       . fenofibrate (TRICOR) 145 MG tablet Take 145 mg by mouth daily.       . fish oil-omega-3 fatty acids 1000 MG capsule Take 2 g by mouth daily.       Marland Kitchen glimepiride (AMARYL) 2 MG tablet Take 2 mg by mouth daily before breakfast.      . insulin glargine (LANTUS) 100 UNIT/ML injection Inject 25 Units into the skin at bedtime.        . insulin lispro (HUMALOG) 100 UNIT/ML  injection Inject 25 Units into the skin 3 (three) times daily before meals.        Marland Kitchen therapeutic multivitamin-minerals (THERAGRAN-M) tablet Take 1 tablet by mouth daily.        Marland Kitchen VITAMIN D, CHOLECALCIFEROL, PO Take 1 capsule by mouth daily.        Physical Exam: Filed Vitals:   11/02/11 1021  BP: 119/56  Pulse: 70  Height: 5\' 5"  (1.651 m)  Weight: 162 lb (73.483 kg)    GEN- The patient is well appearing, alert and oriented x 3 today.   Head- normocephalic, atraumatic Eyes-  Sclera clear, conjunctiva pink Ears- hearing intact Oropharynx- clear Lungs- Clear to ausculation bilaterally, normal work of breathing Chest- pacemaker pocket is well healed Heart- Regular rate and rhythm, no murmurs, rubs or gallops, PMI not laterally displaced GI- soft, NT, ND, + BS Extremities- no clubbing, cyanosis, or edema  Pacemaker interrogation- reviewed in detail today,  See PACEART report  Assessment and Plan:

## 2011-11-02 NOTE — Assessment & Plan Note (Signed)
Normal pacemaker function See Pace Art report No changes today  

## 2011-11-02 NOTE — Assessment & Plan Note (Signed)
Nonsustained afib detected on PPM interrogation today. Longest episode is 24 seconds. We will monitor.  If she develops significant increase in afib duration, then we will consider anticoagulation at that time.

## 2012-02-22 IMAGING — CR DG CHEST 2V
1 series · 1 of 1 positions shown · non-contrast
Comparison: None.

CLINICAL DATA: New insertion of pacemaker.

CHEST - 2 VIEW

[w chest lat]
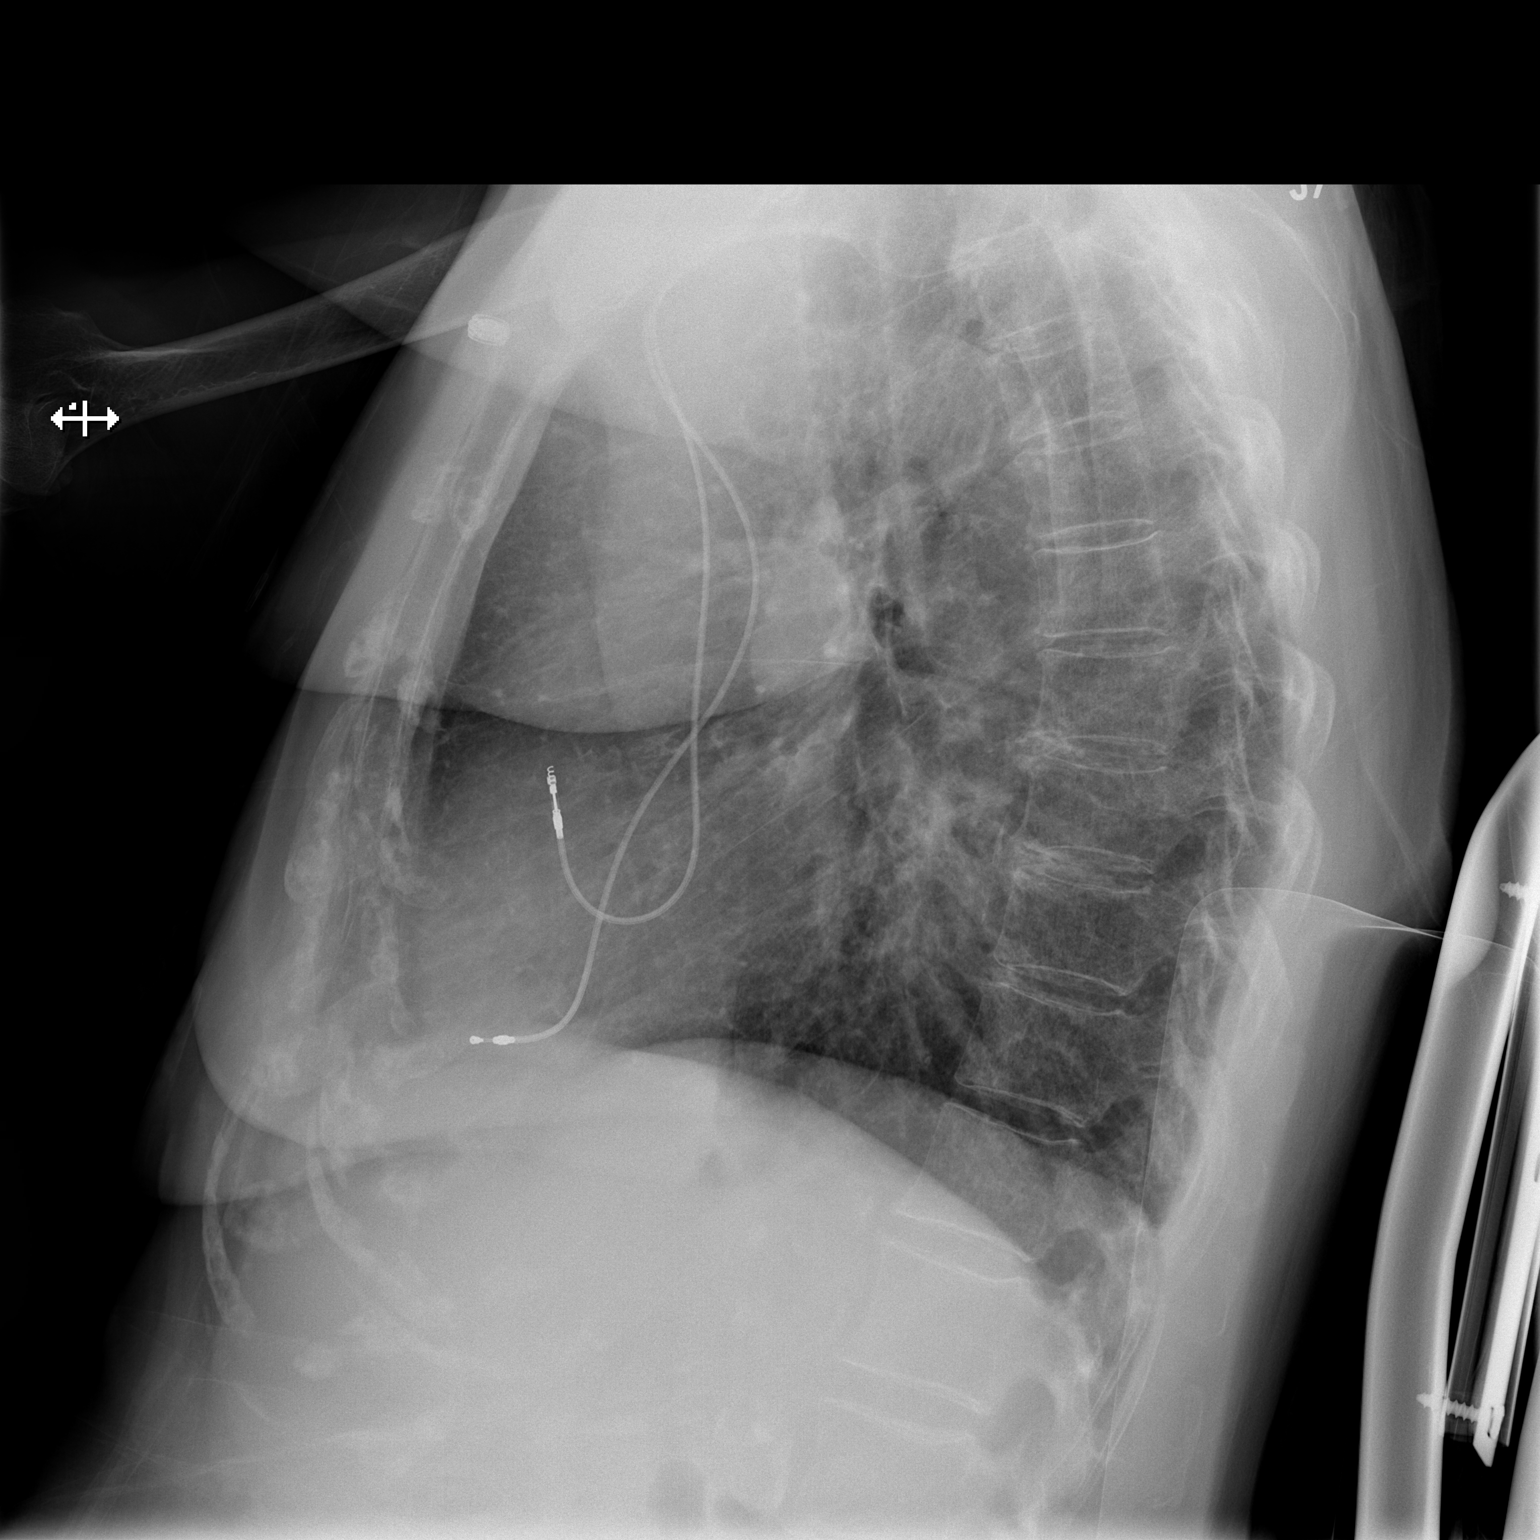

[1 of 1 positions shown; findings below may reference images not displayed]

FINDINGS: Dual lead pacemaker is in place.  No pneumothorax.  Heart
size and vascularity are normal.  Lungs are clear.  No osseous
abnormality.
IMPRESSION: No acute abnormalities.  Dual lead pacer in place.

## 2012-07-18 ENCOUNTER — Encounter: Payer: Self-pay | Admitting: Internal Medicine

## 2012-07-18 ENCOUNTER — Ambulatory Visit (INDEPENDENT_AMBULATORY_CARE_PROVIDER_SITE_OTHER): Payer: Medicare Other | Admitting: Internal Medicine

## 2012-07-18 VITALS — BP 126/67 | HR 89 | Ht 65.0 in | Wt 165.8 lb

## 2012-07-18 DIAGNOSIS — I10 Essential (primary) hypertension: Secondary | ICD-10-CM

## 2012-07-18 DIAGNOSIS — I4891 Unspecified atrial fibrillation: Secondary | ICD-10-CM

## 2012-07-18 DIAGNOSIS — R001 Bradycardia, unspecified: Secondary | ICD-10-CM

## 2012-07-18 DIAGNOSIS — I498 Other specified cardiac arrhythmias: Secondary | ICD-10-CM

## 2012-07-18 DIAGNOSIS — I441 Atrioventricular block, second degree: Secondary | ICD-10-CM

## 2012-07-18 LAB — PACEMAKER DEVICE OBSERVATION
AL IMPEDENCE PM: 375 Ohm
ATRIAL PACING PM: 12
BAMS-0003: 70 {beats}/min
BATTERY VOLTAGE: 2.9478 V
RV LEAD AMPLITUDE: 12 mv
VENTRICULAR PACING PM: 1.2

## 2012-07-18 NOTE — Assessment & Plan Note (Signed)
Stable No change required today  

## 2012-07-18 NOTE — Assessment & Plan Note (Signed)
Pacemaker interrogation reveals <1% afib, longest episode was 2hours 46 minutes. Today, I have recommended anticoagulation with coumadin or a novel anticoagulant.  The patient and her family are clear in their decision to decline anticoagulation, particularly given their concerns over her recent fall. We will discuss this again upon follow-up.  AVEROES data would support eliquis as a safe alternative to ASA for this patient.

## 2012-07-18 NOTE — Patient Instructions (Signed)
Continue all current medications. Allred - 1 year  

## 2012-07-18 NOTE — Assessment & Plan Note (Signed)
Normal pacemaker function See Pace Art report No changes today  

## 2012-07-18 NOTE — Progress Notes (Signed)
PCP: Toma Deiters, MD  Angela Mcdaniel is a 79 y.o. female who presents today for routine electrophysiology followup.  Since having her pacemaker implanted, the patient reports doing very well.   Her primary remains unsteadiness with ambulation.   She lost her balance and fell last week but did not sustain injury.  Today, she denies symptoms of palpitations, chest pain, shortness of breath,  lower extremity edema, dizziness, presyncope, or syncope.  The patient is otherwise without complaint today.   Past Medical History  Diagnosis Date  . Essential hypertension, benign   . Neuropathy   . Diabetes mellitus   . High cholesterol   . Peripheral vascular disease     by carotid dopplers recently with 50-69% right ICA, and less than 50% left ICA stenosis  . Arthritis   . Gout attack     "once; several years ago"  . Aortic stenosis     Mild aortic stenosis by echo recently  . Second degree AV block, Mobitz type II     Symptomatic s/p St. Jude PPM 07/05/11  . Paroxysmal atrial fibrillation     detected by pacemaker interrogation, longest episode is only 24 seconds   Past Surgical History  Procedure Laterality Date  . Cataract extraction w/ intraocular lens  implant, bilateral    . Refractive surgery  02/2011    left eye  . Pacemaker insertion  07/05/11    SJM Accent DR RF pacemaker implanted by Dr Johney Frame    Current Outpatient Prescriptions  Medication Sig Dispense Refill  . aspirin EC 81 MG tablet Take 81 mg by mouth daily.       . fenofibrate (TRICOR) 145 MG tablet Take 145 mg by mouth daily.       . fish oil-omega-3 fatty acids 1000 MG capsule Take 2 g by mouth daily.       Marland Kitchen glimepiride (AMARYL) 2 MG tablet Take 2 mg by mouth daily before breakfast.      . insulin glargine (LANTUS) 100 UNIT/ML injection Inject 25 Units into the skin at bedtime.        . insulin lispro (HUMALOG) 100 UNIT/ML injection Inject 25 Units into the skin 3 (three) times daily before meals.        Marland Kitchen therapeutic  multivitamin-minerals (THERAGRAN-M) tablet Take 1 tablet by mouth daily.        Marland Kitchen VITAMIN D, CHOLECALCIFEROL, PO Take 1 capsule by mouth daily.       No current facility-administered medications for this visit.    Physical Exam: Filed Vitals:   07/18/12 0836  BP: 126/67  Pulse: 89  Height: 5\' 5"  (1.651 m)  Weight: 165 lb 12.8 oz (75.206 kg)    GEN- The patient is well appearing, alert and oriented x 3 today.   Head- normocephalic, atraumatic Eyes-  Sclera clear, conjunctiva pink Ears- hearing intact Oropharynx- clear Lungs- Clear to ausculation bilaterally, normal work of breathing Chest- pacemaker pocket is well healed Heart- Regular rate and rhythm,  GI- soft, NT, ND, + BS Extremities- no clubbing, cyanosis, or edema  Pacemaker interrogation- reviewed in detail today,  See PACEART report  Assessment and Plan:

## 2012-09-12 ENCOUNTER — Telehealth: Payer: Self-pay | Admitting: Internal Medicine

## 2012-09-12 NOTE — Telephone Encounter (Signed)
PATIENT feel a few days ago. Daughter is concerned that something came lose during the fall

## 2012-09-12 NOTE — Telephone Encounter (Signed)
Spoke w/Angela Mcdaniel in regards to mother falling. Pt to send transmission this evening to make sure device is functioning properly. Will call family once transmission received.

## 2012-09-13 NOTE — Telephone Encounter (Signed)
Transmission was not received. Left message with daughter, Liborio Nixon to inquire.

## 2012-10-15 ENCOUNTER — Ambulatory Visit (INDEPENDENT_AMBULATORY_CARE_PROVIDER_SITE_OTHER): Payer: Medicare Other | Admitting: *Deleted

## 2012-10-15 DIAGNOSIS — Z950 Presence of cardiac pacemaker: Secondary | ICD-10-CM

## 2012-10-15 DIAGNOSIS — I441 Atrioventricular block, second degree: Secondary | ICD-10-CM

## 2012-10-15 LAB — REMOTE PACEMAKER DEVICE
AL AMPLITUDE: 2.9 mv
AL THRESHOLD: 0.625 V
BAMS-0001: 180 {beats}/min
DEVICE MODEL PM: 7308736
RV LEAD AMPLITUDE: 12 mv
RV LEAD IMPEDENCE PM: 610 Ohm
RV LEAD THRESHOLD: 0.75 V

## 2012-11-02 ENCOUNTER — Encounter: Payer: Self-pay | Admitting: *Deleted

## 2012-11-14 ENCOUNTER — Encounter: Payer: Self-pay | Admitting: Internal Medicine

## 2013-01-14 ENCOUNTER — Ambulatory Visit (INDEPENDENT_AMBULATORY_CARE_PROVIDER_SITE_OTHER): Payer: Medicare Other | Admitting: *Deleted

## 2013-01-14 DIAGNOSIS — I441 Atrioventricular block, second degree: Secondary | ICD-10-CM

## 2013-01-14 DIAGNOSIS — Z950 Presence of cardiac pacemaker: Secondary | ICD-10-CM

## 2013-01-15 LAB — REMOTE PACEMAKER DEVICE
AL IMPEDENCE PM: 380 Ohm
ATRIAL PACING PM: 13
BAMS-0003: 70 {beats}/min
RV LEAD IMPEDENCE PM: 600 Ohm
RV LEAD THRESHOLD: 0.625 V
VENTRICULAR PACING PM: 1

## 2013-01-30 ENCOUNTER — Encounter: Payer: Self-pay | Admitting: *Deleted

## 2013-03-05 ENCOUNTER — Encounter: Payer: Self-pay | Admitting: Internal Medicine

## 2013-04-22 ENCOUNTER — Other Ambulatory Visit: Payer: Self-pay | Admitting: Internal Medicine

## 2013-04-22 ENCOUNTER — Ambulatory Visit (INDEPENDENT_AMBULATORY_CARE_PROVIDER_SITE_OTHER): Payer: Medicare Other | Admitting: *Deleted

## 2013-04-22 DIAGNOSIS — I4891 Unspecified atrial fibrillation: Secondary | ICD-10-CM

## 2013-04-22 DIAGNOSIS — I441 Atrioventricular block, second degree: Secondary | ICD-10-CM

## 2013-04-22 DIAGNOSIS — I480 Paroxysmal atrial fibrillation: Secondary | ICD-10-CM

## 2013-04-28 LAB — MDC_IDC_ENUM_SESS_TYPE_REMOTE
Battery Remaining Longevity: 101 mo
Battery Voltage: 2.95 V
Brady Statistic AS VP Percent: 1 %
Brady Statistic RA Percent Paced: 15 %
Date Time Interrogation Session: 20141124084224
Implantable Pulse Generator Model: 2210
Implantable Pulse Generator Serial Number: 7308736
Lead Channel Impedance Value: 360 Ohm
Lead Channel Pacing Threshold Amplitude: 0.625 V
Lead Channel Pacing Threshold Pulse Width: 0.4 ms
Lead Channel Sensing Intrinsic Amplitude: 2.6 mV
Lead Channel Setting Pacing Pulse Width: 0.4 ms

## 2013-05-02 ENCOUNTER — Ambulatory Visit (INDEPENDENT_AMBULATORY_CARE_PROVIDER_SITE_OTHER): Payer: Medicare Other | Admitting: Cardiovascular Disease

## 2013-05-02 VITALS — BP 113/65 | HR 80 | Ht 65.5 in | Wt 167.0 lb

## 2013-05-02 DIAGNOSIS — I480 Paroxysmal atrial fibrillation: Secondary | ICD-10-CM

## 2013-05-02 DIAGNOSIS — Z950 Presence of cardiac pacemaker: Secondary | ICD-10-CM

## 2013-05-02 DIAGNOSIS — I441 Atrioventricular block, second degree: Secondary | ICD-10-CM

## 2013-05-02 DIAGNOSIS — I4891 Unspecified atrial fibrillation: Secondary | ICD-10-CM

## 2013-05-02 DIAGNOSIS — E785 Hyperlipidemia, unspecified: Secondary | ICD-10-CM

## 2013-05-02 DIAGNOSIS — I359 Nonrheumatic aortic valve disorder, unspecified: Secondary | ICD-10-CM

## 2013-05-02 DIAGNOSIS — Z23 Encounter for immunization: Secondary | ICD-10-CM

## 2013-05-02 DIAGNOSIS — I350 Nonrheumatic aortic (valve) stenosis: Secondary | ICD-10-CM

## 2013-05-02 DIAGNOSIS — I779 Disorder of arteries and arterioles, unspecified: Secondary | ICD-10-CM

## 2013-05-02 DIAGNOSIS — I10 Essential (primary) hypertension: Secondary | ICD-10-CM

## 2013-05-02 NOTE — Patient Instructions (Signed)
Continue all current medications. Your physician wants you to follow up in: 6 months.  You will receive a reminder letter in the mail one-two months in advance.  If you don't receive a letter, please call our office to schedule the follow up appointment   

## 2013-05-02 NOTE — Progress Notes (Signed)
Patient ID: Angela Mcdaniel, female   DOB: 1921/06/22, 79 y.o.   MRN: [id number]      SUBJECTIVE: The patient is an 79 year old woman with a past medical history significant for symptomatic second-degree A-V block status post placement of a St. Jude's pacemaker on 07/05/2011. She also has a history of hypertension, carotid artery stenosis, diabetes mellitus, hyperlipidemia, and mild aortic stenosis. She has been noted to have paroxysmal atrial fibrillation and also has a history of falls, and declined anticoagulation at her visit with Dr. Johney Frame on 07/18/2012. She is present with her daughter today and neither her daughter nor the patient recall a conversation about being on a blood thinner for atrial fibrillation. The patient says that some light and artificial light bother her, but she denies any eye pain or headaches with this. She also denies chest pain, shortness of breath, palpitations and leg swelling. She says that she gets dizzy if she stands up too quickly. She has had no recent falls. This past summer she was walking around in the middle of the night and became a little disoriented because the nightlight was off and she had a fall but did not sustain any injuries.  At her last pacemaker interrogation in November, she had 14 mode switches with the longest episode of atrial fibrillation lasting 26 seconds.  No Known Allergies  Current Outpatient Prescriptions  Medication Sig Dispense Refill  . aspirin EC 81 MG tablet Take 81 mg by mouth daily.       . fenofibrate (TRICOR) 145 MG tablet Take 145 mg by mouth daily.       . fish oil-omega-3 fatty acids 1000 MG capsule Take 2 g by mouth daily.       Marland Kitchen glimepiride (AMARYL) 2 MG tablet Take 2 mg by mouth daily before breakfast.      . insulin glargine (LANTUS) 100 UNIT/ML injection Inject 25 Units into the skin at bedtime.        . insulin lispro (HUMALOG) 100 UNIT/ML injection Inject 25 Units into the skin 3 (three) times daily before meals.         Marland Kitchen therapeutic multivitamin-minerals (THERAGRAN-M) tablet Take 1 tablet by mouth daily.        Marland Kitchen VITAMIN D, CHOLECALCIFEROL, PO Take 1 capsule by mouth daily.      Marland Kitchen atorvastatin (LIPITOR) 40 MG tablet        No current facility-administered medications for this visit.    Past Medical History  Diagnosis Date  . Essential hypertension, benign   . Neuropathy   . Diabetes mellitus   . High cholesterol   . Peripheral vascular disease     by carotid dopplers recently with 50-69% right ICA, and less than 50% left ICA stenosis  . Arthritis   . Gout attack     "once; several years ago"  . Aortic stenosis     Mild aortic stenosis by echo recently  . Second degree AV block, Mobitz type II     Symptomatic s/p St. Jude PPM 07/05/11  . Paroxysmal atrial fibrillation     detected by pacemaker interrogation    Past Surgical History  Procedure Laterality Date  . Cataract extraction w/ intraocular lens  implant, bilateral    . Refractive surgery  02/2011    left eye  . Pacemaker insertion  07/05/11    SJM Accent DR RF pacemaker implanted by Dr Johney Frame    History   Social History  . Marital Status: Widowed  Spouse Name: N/A    Number of Children: N/A  . Years of Education: N/A   Occupational History  . Not on file.   Social History Main Topics  . Smoking status: Former Smoker -- 1.00 packs/day for 20 years    Types: Cigarettes    Quit date: 05/30/1988  . Smokeless tobacco: Never Used  . Alcohol Use: No  . Drug Use: No  . Sexual Activity: No   Other Topics Concern  . Not on file   Social History Narrative   Has 6 children whom are alive and 1 deceased.     Filed Vitals:   May 20, 2013 0913  BP: 113/65  Pulse: 80  Height: 5' 5.5" (1.664 m)  Weight: 167 lb (75.751 kg)    PHYSICAL EXAM General: NAD Neck: No JVD, no thyromegaly or thyroid nodule.  Lungs: Clear to auscultation bilaterally with normal respiratory effort. CV: Nondisplaced PMI.  Heart regular S1/S2, no  S3/S4, II/VI ejection systolic murmur at RUSB.  No peripheral edema.  No carotid bruit.  Normal pedal pulses.  Abdomen: Soft, nontender, no hepatosplenomegaly, no distention.  Neurologic: Alert and oriented x 3.  Psych: Normal affect. Extremities: No clubbing or cyanosis.   ECG: reviewed and available in electronic records.      ASSESSMENT AND PLAN: 1. 2nd degree AV block s/p PPM: stable with recent interrogation in November as noted above. 2. Paroxysmal atrial fibrillation: As stated previously, the patient had 33 mode switches with the longest episode lasting 26 seconds. I had a long discussion with the patient and her daughter with regards to being on a blood thinner such as Eliquis rather than aspirin. The daughter was particularly thankful that we had this conversation as she does not remember her conversation with Dr. Johney Frame last February. She plans on discussing this further with her mother and will make a final decision at the time of her followup appointment with Dr. Johney Frame in the early part of 2015. I did explain to her the risk for stroke in the setting of paroxysmal atrial fibrillation. 3. Aortic stenosis: mild when most recently evaluated on 06/22/2011. LV systolic function was normal at that time. Continue to monitor clinically. 4. HTN: controlled without medical therapy. 5. Hyperlipidemia: currently taking Lipitor 40 mg daily. 6. Carotid artery stenosis: 50-69% RICA and less than 50% LICA on 06-22-2011.  Dispo: f/u 6 months.   Prentice Docker, M.D., F.A.C.C.

## 2013-05-07 ENCOUNTER — Encounter: Payer: Self-pay | Admitting: *Deleted

## 2013-05-27 ENCOUNTER — Encounter: Payer: Self-pay | Admitting: Internal Medicine

## 2013-09-09 ENCOUNTER — Encounter: Payer: Self-pay | Admitting: Internal Medicine

## 2013-09-09 ENCOUNTER — Ambulatory Visit (INDEPENDENT_AMBULATORY_CARE_PROVIDER_SITE_OTHER): Payer: Medicare Other | Admitting: Internal Medicine

## 2013-09-09 VITALS — BP 118/70 | HR 70 | Ht 65.0 in | Wt 164.1 lb

## 2013-09-09 DIAGNOSIS — I480 Paroxysmal atrial fibrillation: Secondary | ICD-10-CM

## 2013-09-09 DIAGNOSIS — I441 Atrioventricular block, second degree: Secondary | ICD-10-CM

## 2013-09-09 DIAGNOSIS — I10 Essential (primary) hypertension: Secondary | ICD-10-CM

## 2013-09-09 DIAGNOSIS — I4891 Unspecified atrial fibrillation: Secondary | ICD-10-CM

## 2013-09-09 LAB — MDC_IDC_ENUM_SESS_TYPE_INCLINIC
Battery Remaining Longevity: 103.2 mo
Battery Voltage: 2.95 V
Brady Statistic RV Percent Paced: 0.95 %
Implantable Pulse Generator Model: 2210
Implantable Pulse Generator Serial Number: 7308736
Lead Channel Impedance Value: 612.5 Ohm
Lead Channel Pacing Threshold Amplitude: 0.625 V
Lead Channel Pacing Threshold Amplitude: 0.75 V
Lead Channel Sensing Intrinsic Amplitude: 12 mV
Lead Channel Setting Pacing Amplitude: 2.5 V
Lead Channel Setting Pacing Pulse Width: 0.4 ms
Lead Channel Setting Sensing Sensitivity: 2 mV
MDC IDC MSMT LEADCHNL RA IMPEDANCE VALUE: 362.5 Ohm
MDC IDC MSMT LEADCHNL RA PACING THRESHOLD PULSEWIDTH: 0.4 ms
MDC IDC MSMT LEADCHNL RA SENSING INTR AMPL: 2.5 mV
MDC IDC MSMT LEADCHNL RV PACING THRESHOLD PULSEWIDTH: 0.4 ms
MDC IDC SESS DTM: 20150413102616
MDC IDC SET LEADCHNL RA PACING AMPLITUDE: 1.625
MDC IDC STAT BRADY RA PERCENT PACED: 15 %

## 2013-09-09 NOTE — Progress Notes (Signed)
PCP: Toma DeitersHASANAJ,XAJE A, MD Primary Cardiologist:  Dr Patricia PesaKoneswaran  Angela Mcdaniel is a 80 y.o. female who presents today for routine electrophysiology followup.  Since her last visit, the patient reports doing very well.   She has occasional dizziness and unsteadiness but denies recent falls.  Today, she denies symptoms of palpitations, chest pain, shortness of breath,  lower extremity edema,presyncope, or syncope.  The patient is otherwise without complaint today.   Past Medical History  Diagnosis Date  . Essential hypertension, benign   . Neuropathy   . Diabetes mellitus   . High cholesterol   . Peripheral vascular disease     by carotid dopplers recently with 50-69% right ICA, and less than 50% left ICA stenosis  . Arthritis   . Gout attack     "once; several years ago"  . Aortic stenosis     Mild aortic stenosis by echo recently  . Second degree AV block, Mobitz type II     Symptomatic s/p St. Jude PPM 07/05/11  . Paroxysmal atrial fibrillation     detected by pacemaker interrogation   Past Surgical History  Procedure Laterality Date  . Cataract extraction w/ intraocular lens  implant, bilateral    . Refractive surgery  02/2011    left eye  . Pacemaker insertion  07/05/11    SJM Accent DR RF pacemaker implanted by Dr Johney FrameAllred    Current Outpatient Prescriptions  Medication Sig Dispense Refill  . aspirin EC 81 MG tablet Take 81 mg by mouth daily.       . fenofibrate (TRICOR) 145 MG tablet Take 145 mg by mouth daily.       Marland Kitchen. glimepiride (AMARYL) 2 MG tablet Take 2 mg by mouth daily before breakfast.      . insulin glargine (LANTUS) 100 UNIT/ML injection Inject 25 Units into the skin at bedtime.        . insulin lispro (HUMALOG) 100 UNIT/ML injection Inject 25 Units into the skin 3 (three) times daily before meals.        Marland Kitchen. KRILL OIL PO Take 1 tablet by mouth 2 (two) times daily.      Marland Kitchen. therapeutic multivitamin-minerals (THERAGRAN-M) tablet Take 1 tablet by mouth daily.        Marland Kitchen. VITAMIN  D, CHOLECALCIFEROL, PO Take 1 capsule by mouth daily.       No current facility-administered medications for this visit.    Physical Exam: Filed Vitals:   09/09/13 1018  BP: 118/70  Pulse: 70  Height: 5\' 5"  (1.651 m)  Weight: 164 lb 1.9 oz (74.444 kg)  SpO2: 97%    GEN- The patient is elderly appearing, alert and oriented x 3 today.   Head- normocephalic, atraumatic Eyes-  Sclera clear, conjunctiva pink Ears- hearing intact Oropharynx- clear Lungs- Clear to ausculation bilaterally, normal work of breathing Chest- pacemaker pocket is well healed Heart- Regular rate and rhythm,  GI- soft, NT, ND, + BS Extremities- no clubbing, cyanosis, or edema  Pacemaker interrogation- reviewed in detail today,  See PACEART report  Assessment and Plan:  1. Second degree AV block Normal pacemaker function See Pace Art report No changes today  2. afib Longest episode is 43 minutes chads2vasc score is at least 6.  Dr Purvis SheffieldKoneswaran and I have both discussed anticoagulation.  She remains very clear in her decision to avoid anticoagulation at this time.  3. htn Stable No change required today  Merlin Return in 1 year to the device clinic Follow-up with  Dr Purvis SheffieldKoneswaran as scheduled

## 2013-09-09 NOTE — Patient Instructions (Signed)
Continue all current medications. Merlin remote check 12/11/2013 Your physician wants you to follow up in:  1 year.  You will receive a reminder letter in the mail one-two months in advance.  If you don't receive a letter, please call our office to schedule the follow up appointment - DR. ALLRED

## 2013-12-11 ENCOUNTER — Ambulatory Visit (INDEPENDENT_AMBULATORY_CARE_PROVIDER_SITE_OTHER): Payer: Medicare Other | Admitting: *Deleted

## 2013-12-11 DIAGNOSIS — I441 Atrioventricular block, second degree: Secondary | ICD-10-CM

## 2013-12-11 LAB — MDC_IDC_ENUM_SESS_TYPE_REMOTE
Battery Remaining Longevity: 100 mo
Battery Remaining Percentage: 77 %
Brady Statistic RA Percent Paced: 18 %
Brady Statistic RV Percent Paced: 1.4 %
Date Time Interrogation Session: 20150715060648
Implantable Pulse Generator Model: 2210
Lead Channel Impedance Value: 350 Ohm
Lead Channel Pacing Threshold Amplitude: 0.625 V
Lead Channel Pacing Threshold Pulse Width: 0.4 ms
Lead Channel Sensing Intrinsic Amplitude: 2.8 mV
Lead Channel Setting Pacing Amplitude: 1.625
Lead Channel Setting Pacing Amplitude: 2.5 V
MDC IDC MSMT BATTERY VOLTAGE: 2.95 V
MDC IDC MSMT LEADCHNL RV IMPEDANCE VALUE: 590 Ohm
MDC IDC MSMT LEADCHNL RV PACING THRESHOLD AMPLITUDE: 0.75 V
MDC IDC MSMT LEADCHNL RV PACING THRESHOLD PULSEWIDTH: 0.4 ms
MDC IDC MSMT LEADCHNL RV SENSING INTR AMPL: 9.6 mV
MDC IDC PG SERIAL: 7308736
MDC IDC SET LEADCHNL RV PACING PULSEWIDTH: 0.4 ms
MDC IDC SET LEADCHNL RV SENSING SENSITIVITY: 2 mV
MDC IDC STAT BRADY AP VP PERCENT: 1 %
MDC IDC STAT BRADY AP VS PERCENT: 20 %
MDC IDC STAT BRADY AS VP PERCENT: 1 %
MDC IDC STAT BRADY AS VS PERCENT: 79 %

## 2013-12-11 NOTE — Progress Notes (Signed)
Remote pacemaker transmission.   

## 2013-12-18 ENCOUNTER — Encounter: Payer: Self-pay | Admitting: Cardiology

## 2013-12-20 ENCOUNTER — Encounter: Payer: Self-pay | Admitting: Internal Medicine

## 2014-03-17 ENCOUNTER — Ambulatory Visit (INDEPENDENT_AMBULATORY_CARE_PROVIDER_SITE_OTHER): Payer: Medicare Other | Admitting: *Deleted

## 2014-03-17 ENCOUNTER — Encounter: Payer: Self-pay | Admitting: Internal Medicine

## 2014-03-17 DIAGNOSIS — I441 Atrioventricular block, second degree: Secondary | ICD-10-CM

## 2014-03-17 NOTE — Progress Notes (Signed)
Remote pacemaker transmission.   

## 2014-03-18 LAB — MDC_IDC_ENUM_SESS_TYPE_REMOTE
Battery Remaining Longevity: 98 mo
Battery Remaining Percentage: 76 %
Battery Voltage: 2.95 V
Brady Statistic AP VP Percent: 1 %
Brady Statistic AS VP Percent: 1 %
Brady Statistic RA Percent Paced: 18 %
Date Time Interrogation Session: 20151019063919
Implantable Pulse Generator Model: 2210
Implantable Pulse Generator Serial Number: 7308736
Lead Channel Impedance Value: 380 Ohm
Lead Channel Impedance Value: 610 Ohm
Lead Channel Pacing Threshold Amplitude: 0.625 V
Lead Channel Pacing Threshold Pulse Width: 0.4 ms
Lead Channel Sensing Intrinsic Amplitude: 4.7 mV
Lead Channel Setting Pacing Amplitude: 2.5 V
Lead Channel Setting Sensing Sensitivity: 2 mV
MDC IDC MSMT LEADCHNL RA PACING THRESHOLD PULSEWIDTH: 0.4 ms
MDC IDC MSMT LEADCHNL RV PACING THRESHOLD AMPLITUDE: 0.75 V
MDC IDC MSMT LEADCHNL RV SENSING INTR AMPL: 11.4 mV
MDC IDC SET LEADCHNL RA PACING AMPLITUDE: 1.625
MDC IDC SET LEADCHNL RV PACING PULSEWIDTH: 0.4 ms
MDC IDC STAT BRADY AP VS PERCENT: 19 %
MDC IDC STAT BRADY AS VS PERCENT: 79 %
MDC IDC STAT BRADY RV PERCENT PACED: 1.3 %

## 2014-03-27 ENCOUNTER — Encounter: Payer: Self-pay | Admitting: Cardiology

## 2014-05-08 ENCOUNTER — Encounter (HOSPITAL_COMMUNITY): Payer: Self-pay | Admitting: Internal Medicine

## 2014-06-19 ENCOUNTER — Telehealth: Payer: Self-pay | Admitting: Cardiology

## 2014-06-19 ENCOUNTER — Ambulatory Visit (INDEPENDENT_AMBULATORY_CARE_PROVIDER_SITE_OTHER): Payer: Medicare Other | Admitting: *Deleted

## 2014-06-19 DIAGNOSIS — I441 Atrioventricular block, second degree: Secondary | ICD-10-CM

## 2014-06-19 NOTE — Telephone Encounter (Signed)
Attempted to confirm remote transmission with pt. No answer and was unable to leave a message.   

## 2014-06-21 ENCOUNTER — Encounter: Payer: Self-pay | Admitting: Internal Medicine

## 2014-06-21 DIAGNOSIS — I441 Atrioventricular block, second degree: Secondary | ICD-10-CM

## 2014-06-23 NOTE — Progress Notes (Signed)
Remote pacemaker transmission.   

## 2014-06-26 LAB — MDC_IDC_ENUM_SESS_TYPE_REMOTE
Battery Remaining Longevity: 97 mo
Battery Remaining Percentage: 74 %
Brady Statistic AP VP Percent: 1 %
Brady Statistic AS VP Percent: 1 %
Brady Statistic AS VS Percent: 80 %
Brady Statistic RA Percent Paced: 17 %
Brady Statistic RV Percent Paced: 1.4 %
Implantable Pulse Generator Model: 2210
Implantable Pulse Generator Serial Number: 7308736
Lead Channel Impedance Value: 360 Ohm
Lead Channel Impedance Value: 590 Ohm
Lead Channel Pacing Threshold Amplitude: 0.625 V
Lead Channel Setting Pacing Amplitude: 1.625
Lead Channel Setting Pacing Amplitude: 2.5 V
Lead Channel Setting Sensing Sensitivity: 2 mV
MDC IDC MSMT BATTERY VOLTAGE: 2.95 V
MDC IDC MSMT LEADCHNL RA PACING THRESHOLD PULSEWIDTH: 0.4 ms
MDC IDC MSMT LEADCHNL RA SENSING INTR AMPL: 3.6 mV
MDC IDC MSMT LEADCHNL RV SENSING INTR AMPL: 12 mV
MDC IDC SESS DTM: 20160123071206
MDC IDC SET LEADCHNL RV PACING PULSEWIDTH: 0.4 ms
MDC IDC STAT BRADY AP VS PERCENT: 19 %

## 2014-07-02 ENCOUNTER — Encounter: Payer: Self-pay | Admitting: Cardiology

## 2014-10-10 ENCOUNTER — Encounter: Payer: Self-pay | Admitting: Internal Medicine

## 2014-10-10 ENCOUNTER — Ambulatory Visit (INDEPENDENT_AMBULATORY_CARE_PROVIDER_SITE_OTHER): Payer: Medicare Other | Admitting: Internal Medicine

## 2014-10-10 VITALS — BP 132/64 | HR 67 | Ht 65.0 in | Wt 168.0 lb

## 2014-10-10 DIAGNOSIS — R001 Bradycardia, unspecified: Secondary | ICD-10-CM | POA: Diagnosis not present

## 2014-10-10 DIAGNOSIS — I441 Atrioventricular block, second degree: Secondary | ICD-10-CM | POA: Diagnosis not present

## 2014-10-10 DIAGNOSIS — I480 Paroxysmal atrial fibrillation: Secondary | ICD-10-CM

## 2014-10-10 LAB — CUP PACEART INCLINIC DEVICE CHECK
Battery Voltage: 2.93 V
Brady Statistic RV Percent Paced: 1.6 %
Date Time Interrogation Session: 20160513135739
Lead Channel Pacing Threshold Amplitude: 0.625 V
Lead Channel Pacing Threshold Amplitude: 0.75 V
Lead Channel Pacing Threshold Pulse Width: 0.4 ms
Lead Channel Pacing Threshold Pulse Width: 0.4 ms
Lead Channel Sensing Intrinsic Amplitude: 12 mV
Lead Channel Sensing Intrinsic Amplitude: 2.7 mV
Lead Channel Setting Pacing Amplitude: 2.5 V
Lead Channel Setting Pacing Pulse Width: 0.4 ms
Lead Channel Setting Sensing Sensitivity: 2 mV
MDC IDC MSMT BATTERY REMAINING LONGEVITY: 112.8 mo
MDC IDC MSMT LEADCHNL RA IMPEDANCE VALUE: 362.5 Ohm
MDC IDC MSMT LEADCHNL RV IMPEDANCE VALUE: 575 Ohm
MDC IDC PG SERIAL: 7308736
MDC IDC SET LEADCHNL RA PACING AMPLITUDE: 1.625
MDC IDC STAT BRADY RA PERCENT PACED: 17 %
Pulse Gen Model: 2210

## 2014-10-10 NOTE — Patient Instructions (Signed)
Your physician recommends that you continue on your current medications as directed. Please refer to the Current Medication list given to you today. Merlin device check on 01/12/15. Your physician recommends that you schedule a follow-up appointment in: 1 year. You will receive a reminder letter in the mail in about 10 months reminding you to call and schedule your appointment. If you don't receive this letter, please contact our office.

## 2014-10-10 NOTE — Progress Notes (Signed)
PCP: Toma DeitersHASANAJ,XAJE A, MD Primary Cardiologist:  Dr Patricia PesaKoneswaran  Angela Mcdaniel is a 81 y.o. female who presents today for routine electrophysiology followup.  Since her last visit, the patient reports doing very well.   She has occasional dizziness and unsteadiness but denies recent falls.  Today, she denies symptoms of palpitations, chest pain, shortness of breath,  lower extremity edema,presyncope, or syncope.  The patient is otherwise without complaint today.   Past Medical History  Diagnosis Date  . Essential hypertension, benign   . Neuropathy   . Diabetes mellitus   . High cholesterol   . Peripheral vascular disease     by carotid dopplers recently with 50-69% right ICA, and less than 50% left ICA stenosis  . Arthritis   . Gout attack     "once; several years ago"  . Aortic stenosis     Mild aortic stenosis by echo recently  . Second degree AV block, Mobitz type II     Symptomatic s/p St. Jude PPM 07/05/11  . Paroxysmal atrial fibrillation     detected by pacemaker interrogation   Past Surgical History  Procedure Laterality Date  . Cataract extraction w/ intraocular lens  implant, bilateral    . Refractive surgery  02/2011    left eye  . Pacemaker insertion  07/05/11    SJM Accent DR RF pacemaker implanted by Dr Johney FrameAllred  . Permanent pacemaker insertion N/A 07/05/2011    Procedure: PERMANENT PACEMAKER INSERTION;  Surgeon: Hillis RangeJames Chizuko Trine, MD;  [facility];  Service: Cardiovascular;  Laterality: N/A;    Current Outpatient Prescriptions  Medication Sig Dispense Refill  . aspirin EC 81 MG tablet Take 81 mg by mouth daily.     . fenofibrate (TRICOR) 145 MG tablet Take 145 mg by mouth daily.     Marland Kitchen. glimepiride (AMARYL) 2 MG tablet Take 2 mg by mouth daily before breakfast.    . insulin glargine (LANTUS) 100 UNIT/ML injection Inject 25 Units into the skin at bedtime.      . insulin lispro (HUMALOG) 100 UNIT/ML injection Inject 25 Units into the skin 3 (three) times daily before  meals.      Marland Kitchen. KRILL OIL PO Take 1 tablet by mouth 2 (two) times daily.    Marland Kitchen. therapeutic multivitamin-minerals (THERAGRAN-M) tablet Take 1 tablet by mouth daily.      Marland Kitchen. VITAMIN D, CHOLECALCIFEROL, PO Take 1 capsule by mouth daily.     No current facility-administered medications for this visit.    Physical Exam: Filed Vitals:   10/10/14 1213  BP: 132/64  Pulse: 67  Height: 5\' 5"  (1.651 m)  Weight: 168 lb (76.204 kg)  SpO2: 96%    GEN- The patient is elderly appearing, alert and oriented x 3 today.   Head- normocephalic, atraumatic Eyes-  Sclera clear, conjunctiva pink Ears- hearing intact Oropharynx- clear Lungs- Clear to ausculation bilaterally, normal work of breathing Chest- pacemaker pocket is well healed Heart- Regular rate and rhythm,  GI- soft, NT, ND, + BS Extremities- no clubbing, cyanosis, or edema  Pacemaker interrogation- reviewed in detail today,  See PACEART report  Assessment and Plan:  1. Second degree AV block Normal pacemaker function See Pace Art report No changes today  2. afib chads2vasc score is at least 6.  She remains very clear in her decision to avoid anticoagulation at this time.  3. htn Stable No change required today  Merlin Return in 1 year to the device clinic Follow-up with Dr Purvis SheffieldKoneswaran  as scheduled

## 2015-01-12 ENCOUNTER — Encounter: Payer: Self-pay | Admitting: Internal Medicine

## 2015-01-12 ENCOUNTER — Ambulatory Visit (INDEPENDENT_AMBULATORY_CARE_PROVIDER_SITE_OTHER): Payer: Medicare Other | Admitting: *Deleted

## 2015-01-12 DIAGNOSIS — I441 Atrioventricular block, second degree: Secondary | ICD-10-CM

## 2015-01-12 NOTE — Progress Notes (Signed)
Remote pacemaker transmission.   

## 2015-01-15 LAB — CUP PACEART REMOTE DEVICE CHECK
Battery Remaining Longevity: 105 mo
Battery Voltage: 2.93 V
Brady Statistic AP VS Percent: 21 %
Brady Statistic AS VS Percent: 77 %
Brady Statistic RV Percent Paced: 1.9 %
Date Time Interrogation Session: 20160815062734
Lead Channel Impedance Value: 380 Ohm
Lead Channel Pacing Threshold Amplitude: 0.625 V
Lead Channel Pacing Threshold Amplitude: 0.75 V
Lead Channel Pacing Threshold Pulse Width: 0.4 ms
Lead Channel Sensing Intrinsic Amplitude: 3.9 mV
Lead Channel Setting Pacing Amplitude: 1.625
Lead Channel Setting Pacing Pulse Width: 0.4 ms
MDC IDC MSMT BATTERY REMAINING PERCENTAGE: 81 %
MDC IDC MSMT LEADCHNL RV IMPEDANCE VALUE: 610 Ohm
MDC IDC MSMT LEADCHNL RV PACING THRESHOLD PULSEWIDTH: 0.4 ms
MDC IDC MSMT LEADCHNL RV SENSING INTR AMPL: 12 mV
MDC IDC SET LEADCHNL RV PACING AMPLITUDE: 2.5 V
MDC IDC SET LEADCHNL RV SENSING SENSITIVITY: 2 mV
MDC IDC STAT BRADY AP VP PERCENT: 1 %
MDC IDC STAT BRADY AS VP PERCENT: 1.3 %
MDC IDC STAT BRADY RA PERCENT PACED: 20 %
Pulse Gen Model: 2210
Pulse Gen Serial Number: 7308736

## 2015-01-20 ENCOUNTER — Encounter: Payer: Self-pay | Admitting: Cardiology

## 2015-04-13 ENCOUNTER — Ambulatory Visit (INDEPENDENT_AMBULATORY_CARE_PROVIDER_SITE_OTHER): Payer: Medicare Other | Admitting: *Deleted

## 2015-04-13 DIAGNOSIS — I441 Atrioventricular block, second degree: Secondary | ICD-10-CM

## 2015-04-13 NOTE — Progress Notes (Signed)
Remote pacemaker transmission.   

## 2015-04-19 ENCOUNTER — Encounter: Payer: Self-pay | Admitting: Internal Medicine

## 2015-04-22 LAB — CUP PACEART REMOTE DEVICE CHECK
Battery Remaining Longevity: 104 mo
Battery Remaining Percentage: 81 %
Brady Statistic AP VS Percent: 19 %
Brady Statistic AS VP Percent: 1.2 %
Brady Statistic AS VS Percent: 79 %
Brady Statistic RA Percent Paced: 17 %
Brady Statistic RV Percent Paced: 2.1 %
Date Time Interrogation Session: 20161114085848
Implantable Lead Implant Date: 20130205
Implantable Lead Location: 753859
Implantable Lead Model: 1948
Lead Channel Impedance Value: 360 Ohm
Lead Channel Pacing Threshold Amplitude: 0.625 V
Lead Channel Pacing Threshold Pulse Width: 0.4 ms
Lead Channel Pacing Threshold Pulse Width: 0.4 ms
Lead Channel Sensing Intrinsic Amplitude: 12 mV
Lead Channel Sensing Intrinsic Amplitude: 3.6 mV
Lead Channel Setting Sensing Sensitivity: 2 mV
MDC IDC LEAD IMPLANT DT: 20130205
MDC IDC LEAD LOCATION: 753860
MDC IDC MSMT BATTERY VOLTAGE: 2.93 V
MDC IDC MSMT LEADCHNL RV IMPEDANCE VALUE: 590 Ohm
MDC IDC MSMT LEADCHNL RV PACING THRESHOLD AMPLITUDE: 0.75 V
MDC IDC SET LEADCHNL RA PACING AMPLITUDE: 1.625
MDC IDC SET LEADCHNL RV PACING AMPLITUDE: 2.5 V
MDC IDC SET LEADCHNL RV PACING PULSEWIDTH: 0.4 ms
MDC IDC STAT BRADY AP VP PERCENT: 1 %
Pulse Gen Serial Number: 7308736

## 2015-04-29 ENCOUNTER — Encounter: Payer: Self-pay | Admitting: Cardiology

## 2015-06-29 DIAGNOSIS — Z1389 Encounter for screening for other disorder: Secondary | ICD-10-CM | POA: Diagnosis not present

## 2015-06-29 DIAGNOSIS — I10 Essential (primary) hypertension: Secondary | ICD-10-CM | POA: Diagnosis not present

## 2015-06-29 DIAGNOSIS — Z0000 Encounter for general adult medical examination without abnormal findings: Secondary | ICD-10-CM | POA: Diagnosis not present

## 2015-06-29 DIAGNOSIS — E1122 Type 2 diabetes mellitus with diabetic chronic kidney disease: Secondary | ICD-10-CM | POA: Diagnosis not present

## 2015-07-13 ENCOUNTER — Ambulatory Visit (INDEPENDENT_AMBULATORY_CARE_PROVIDER_SITE_OTHER): Payer: Medicare Other | Admitting: *Deleted

## 2015-07-13 DIAGNOSIS — I441 Atrioventricular block, second degree: Secondary | ICD-10-CM

## 2015-07-13 NOTE — Progress Notes (Signed)
Remote pacemaker transmission.   

## 2015-07-15 ENCOUNTER — Encounter: Payer: Self-pay | Admitting: Cardiology

## 2015-07-15 LAB — CUP PACEART REMOTE DEVICE CHECK
Battery Voltage: 2.95 V
Brady Statistic AP VP Percent: 1 %
Brady Statistic AP VS Percent: 18 %
Brady Statistic AS VP Percent: 1.2 %
Brady Statistic AS VS Percent: 80 %
Brady Statistic RA Percent Paced: 16 %
Date Time Interrogation Session: 20170213073015
Implantable Lead Implant Date: 20130205
Implantable Lead Location: 753860
Implantable Lead Model: 1948
Lead Channel Impedance Value: 350 Ohm
Lead Channel Pacing Threshold Pulse Width: 0.4 ms
Lead Channel Sensing Intrinsic Amplitude: 11 mV
Lead Channel Setting Pacing Amplitude: 2.5 V
Lead Channel Setting Pacing Pulse Width: 0.4 ms
Lead Channel Setting Sensing Sensitivity: 2 mV
MDC IDC LEAD IMPLANT DT: 20130205
MDC IDC LEAD LOCATION: 753859
MDC IDC MSMT BATTERY REMAINING LONGEVITY: 116 mo
MDC IDC MSMT BATTERY REMAINING PERCENTAGE: 91 %
MDC IDC MSMT LEADCHNL RA PACING THRESHOLD AMPLITUDE: 0.625 V
MDC IDC MSMT LEADCHNL RA SENSING INTR AMPL: 3.1 mV
MDC IDC MSMT LEADCHNL RV IMPEDANCE VALUE: 590 Ohm
MDC IDC SET LEADCHNL RA PACING AMPLITUDE: 1.625
MDC IDC STAT BRADY RV PERCENT PACED: 2.1 %
Pulse Gen Model: 2210
Pulse Gen Serial Number: 7308736

## 2015-09-29 DIAGNOSIS — M79641 Pain in right hand: Secondary | ICD-10-CM | POA: Diagnosis not present

## 2015-09-29 DIAGNOSIS — T148 Other injury of unspecified body region: Secondary | ICD-10-CM | POA: Diagnosis not present

## 2015-09-29 DIAGNOSIS — M25511 Pain in right shoulder: Secondary | ICD-10-CM | POA: Diagnosis not present

## 2015-09-29 DIAGNOSIS — S6991XA Unspecified injury of right wrist, hand and finger(s), initial encounter: Secondary | ICD-10-CM | POA: Diagnosis not present

## 2015-09-29 DIAGNOSIS — S66911A Strain of unspecified muscle, fascia and tendon at wrist and hand level, right hand, initial encounter: Secondary | ICD-10-CM | POA: Diagnosis not present

## 2015-09-29 DIAGNOSIS — Z87891 Personal history of nicotine dependence: Secondary | ICD-10-CM | POA: Diagnosis not present

## 2015-09-29 DIAGNOSIS — S199XXA Unspecified injury of neck, initial encounter: Secondary | ICD-10-CM | POA: Diagnosis not present

## 2015-09-29 DIAGNOSIS — M79601 Pain in right arm: Secondary | ICD-10-CM | POA: Diagnosis not present

## 2015-09-29 DIAGNOSIS — E119 Type 2 diabetes mellitus without complications: Secondary | ICD-10-CM | POA: Diagnosis not present

## 2015-09-29 DIAGNOSIS — S022XXA Fracture of nasal bones, initial encounter for closed fracture: Secondary | ICD-10-CM | POA: Diagnosis not present

## 2015-09-29 DIAGNOSIS — M79602 Pain in left arm: Secondary | ICD-10-CM | POA: Diagnosis not present

## 2015-09-29 DIAGNOSIS — S66912A Strain of unspecified muscle, fascia and tendon at wrist and hand level, left hand, initial encounter: Secondary | ICD-10-CM | POA: Diagnosis not present

## 2015-09-29 DIAGNOSIS — M25531 Pain in right wrist: Secondary | ICD-10-CM | POA: Diagnosis not present

## 2015-09-29 DIAGNOSIS — S0990XA Unspecified injury of head, initial encounter: Secondary | ICD-10-CM | POA: Diagnosis not present

## 2015-09-29 DIAGNOSIS — S0083XA Contusion of other part of head, initial encounter: Secondary | ICD-10-CM | POA: Diagnosis not present

## 2015-09-29 DIAGNOSIS — S43401A Unspecified sprain of right shoulder joint, initial encounter: Secondary | ICD-10-CM | POA: Diagnosis not present

## 2015-09-29 DIAGNOSIS — M79642 Pain in left hand: Secondary | ICD-10-CM | POA: Diagnosis not present

## 2015-09-29 DIAGNOSIS — M25552 Pain in left hip: Secondary | ICD-10-CM | POA: Diagnosis not present

## 2015-10-02 DIAGNOSIS — Z0000 Encounter for general adult medical examination without abnormal findings: Secondary | ICD-10-CM | POA: Diagnosis not present

## 2015-10-02 DIAGNOSIS — I10 Essential (primary) hypertension: Secondary | ICD-10-CM | POA: Diagnosis not present

## 2015-10-02 DIAGNOSIS — E1122 Type 2 diabetes mellitus with diabetic chronic kidney disease: Secondary | ICD-10-CM | POA: Diagnosis not present

## 2015-10-02 DIAGNOSIS — M950 Acquired deformity of nose: Secondary | ICD-10-CM | POA: Diagnosis not present

## 2015-12-04 ENCOUNTER — Encounter: Payer: Medicare Other | Admitting: Internal Medicine

## 2015-12-25 ENCOUNTER — Ambulatory Visit (INDEPENDENT_AMBULATORY_CARE_PROVIDER_SITE_OTHER): Payer: Medicare Other | Admitting: Internal Medicine

## 2015-12-25 ENCOUNTER — Encounter: Payer: Self-pay | Admitting: Internal Medicine

## 2015-12-25 DIAGNOSIS — I441 Atrioventricular block, second degree: Secondary | ICD-10-CM

## 2015-12-25 DIAGNOSIS — I480 Paroxysmal atrial fibrillation: Secondary | ICD-10-CM | POA: Diagnosis not present

## 2015-12-25 DIAGNOSIS — R001 Bradycardia, unspecified: Secondary | ICD-10-CM | POA: Diagnosis not present

## 2015-12-25 LAB — CUP PACEART INCLINIC DEVICE CHECK
Battery Remaining Longevity: 112.8
Date Time Interrogation Session: 20170728142925
Implantable Lead Implant Date: 20130205
Implantable Lead Implant Date: 20130205
Implantable Lead Location: 753859
Implantable Lead Location: 753860
Lead Channel Impedance Value: 362.5 Ohm
Lead Channel Impedance Value: 650 Ohm
Lead Channel Pacing Threshold Amplitude: 0.75 V
Lead Channel Pacing Threshold Pulse Width: 0.4 ms
Lead Channel Pacing Threshold Pulse Width: 0.4 ms
Lead Channel Sensing Intrinsic Amplitude: 12 mV
Lead Channel Setting Pacing Amplitude: 1.625
Lead Channel Setting Pacing Pulse Width: 0.4 ms
Lead Channel Setting Sensing Sensitivity: 2 mV
MDC IDC LEAD MODEL: 1948
MDC IDC MSMT BATTERY VOLTAGE: 2.93 V
MDC IDC MSMT LEADCHNL RA PACING THRESHOLD AMPLITUDE: 0.625 V
MDC IDC MSMT LEADCHNL RA SENSING INTR AMPL: 3.3 mV
MDC IDC SET LEADCHNL RV PACING AMPLITUDE: 2.5 V
MDC IDC STAT BRADY RA PERCENT PACED: 17 %
MDC IDC STAT BRADY RV PERCENT PACED: 1.9 %
Pulse Gen Serial Number: 7308736

## 2015-12-25 NOTE — Progress Notes (Signed)
PCP: Toma Deiters, MD Primary Cardiologist:  Dr Patricia Pesa is a 82 y.o. female who presents today for routine electrophysiology followup.  Since her last visit, the patient reports doing very well.   She sleeps "all the time".   Today, she denies symptoms of palpitations, chest pain, shortness of breath,  lower extremity edema,presyncope, or syncope.  The patient is otherwise without complaint today.   Past Medical History:  Diagnosis Date  . Aortic stenosis    Mild aortic stenosis by echo recently  . Arthritis   . Diabetes mellitus   . Essential hypertension, benign   . Gout attack    "once; several years ago"  . High cholesterol   . Neuropathy (HCC)   . Paroxysmal atrial fibrillation (HCC)    detected by pacemaker interrogation  . Peripheral vascular disease (HCC)    by carotid dopplers recently with 50-69% right ICA, and less than 50% left ICA stenosis  . Second degree AV block, Mobitz type II    Symptomatic s/p St. Jude PPM 07/05/11   Past Surgical History:  Procedure Laterality Date  . CATARACT EXTRACTION W/ INTRAOCULAR LENS  IMPLANT, BILATERAL    . PACEMAKER INSERTION  07/05/11   SJM Accent DR RF pacemaker implanted by Dr Johney Frame  . PERMANENT PACEMAKER INSERTION N/A 07/05/2011   Procedure: PERMANENT PACEMAKER INSERTION;  Surgeon: Hillis Range, MD;  Location: [facility] CATH LAB;  Service: Cardiovascular;  Laterality: N/A;  . REFRACTIVE SURGERY  02/2011   left eye    Current Outpatient Prescriptions  Medication Sig Dispense Refill  . aspirin EC 81 MG tablet Take 81 mg by mouth daily.     . fenofibrate (TRICOR) 145 MG tablet Take 145 mg by mouth daily.     Marland Kitchen glimepiride (AMARYL) 2 MG tablet Take 2 mg by mouth daily before breakfast.    . insulin glargine (LANTUS) 100 UNIT/ML injection Inject 25 Units into the skin at bedtime.      . insulin lispro (HUMALOG) 100 UNIT/ML injection Inject 25 Units into the skin 3 (three) times daily before meals.      Marland Kitchen KRILL OIL PO  Take 1 tablet by mouth 2 (two) times daily.    Marland Kitchen therapeutic multivitamin-minerals (THERAGRAN-M) tablet Take 1 tablet by mouth daily.      Marland Kitchen VITAMIN D, CHOLECALCIFEROL, PO Take 1 capsule by mouth daily.     No current facility-administered medications for this visit.     Physical Exam: Vitals:   12/25/15 1022  BP: 128/64  Pulse: 68  SpO2: 95%  Weight: 163 lb (73.9 kg)  Height:  (1.651 m)    GEN- The patient is elderly appearing, alert and oriented x 3 today.   Head- normocephalic, atraumatic Eyes-  Sclera clear, conjunctiva pink Ears- very poor hearing Oropharynx- clear Lungs- Clear to ausculation bilaterally, normal work of breathing Chest- pacemaker pocket is well healed Heart- Regular rate and rhythm,  GI- soft, NT, ND, + BS Extremities- no clubbing, cyanosis, or edema  Pacemaker interrogation- reviewed in detail today,  See PACEART report  Assessment and Plan:  1. Second degree AV block Normal pacemaker function See Pace Art report No changes today  2. afib chads2vasc score is at least 6.  She remains very clear in her decision to avoid anticoagulation at this time.  AF <1% burden.  3. htn Stable No change required today  Merlin Return in 1 year to the device clinic  Hillis Range MD, [facility]  12/25/2015 10:55 AM

## 2015-12-25 NOTE — Patient Instructions (Addendum)
Medication Instructions:   Your physician recommends that you continue on your current medications as directed. Please refer to the Current Medication list given to you today.  Labwork: NONE  Testing/Procedures:  Device check from home on 03/28/16.  Follow-Up: Your physician recommends that you schedule a follow-up appointment in: 1 year with Dr. Allred. Please schedule this appointment today before leaving the office.   Any Other Special Instructions Will Be Listed Below (If Applicable).  If you need a refill on your cardiac medications before your next appointment, please call your pharmacy. 

## 2015-12-31 ENCOUNTER — Encounter: Payer: Self-pay | Admitting: Internal Medicine

## 2016-01-29 DIAGNOSIS — E1122 Type 2 diabetes mellitus with diabetic chronic kidney disease: Secondary | ICD-10-CM | POA: Diagnosis not present

## 2016-01-29 DIAGNOSIS — I10 Essential (primary) hypertension: Secondary | ICD-10-CM | POA: Diagnosis not present

## 2016-03-28 ENCOUNTER — Ambulatory Visit (INDEPENDENT_AMBULATORY_CARE_PROVIDER_SITE_OTHER): Payer: Medicare Other | Admitting: *Deleted

## 2016-03-28 ENCOUNTER — Telehealth: Payer: Self-pay | Admitting: Cardiology

## 2016-03-28 DIAGNOSIS — I441 Atrioventricular block, second degree: Secondary | ICD-10-CM

## 2016-03-28 NOTE — Telephone Encounter (Signed)
LMOVM reminding pt to send remote transmission.   

## 2016-03-29 NOTE — Progress Notes (Signed)
Remote pacemaker transmission.   

## 2016-04-01 ENCOUNTER — Encounter: Payer: Self-pay | Admitting: Cardiology

## 2016-05-08 LAB — CUP PACEART REMOTE DEVICE CHECK
Battery Remaining Longevity: 105 mo
Battery Voltage: 2.93 V
Brady Statistic AS VP Percent: 1.5 %
Brady Statistic AS VS Percent: 81 %
Brady Statistic RA Percent Paced: 16 %
Date Time Interrogation Session: 20171031010355
Implantable Lead Implant Date: 20130205
Implantable Lead Location: 753859
Implantable Lead Location: 753860
Implantable Lead Model: 1948
Lead Channel Impedance Value: 360 Ohm
Lead Channel Impedance Value: 600 Ohm
Lead Channel Pacing Threshold Amplitude: 0.625 V
Lead Channel Pacing Threshold Amplitude: 0.75 V
Lead Channel Pacing Threshold Pulse Width: 0.4 ms
Lead Channel Pacing Threshold Pulse Width: 0.4 ms
Lead Channel Sensing Intrinsic Amplitude: 12 mV
Lead Channel Setting Pacing Amplitude: 2.5 V
MDC IDC LEAD IMPLANT DT: 20130205
MDC IDC MSMT BATTERY REMAINING PERCENTAGE: 81 %
MDC IDC MSMT LEADCHNL RA SENSING INTR AMPL: 3.1 mV
MDC IDC PG IMPLANT DT: 20130205
MDC IDC PG SERIAL: 7308736
MDC IDC SET LEADCHNL RA PACING AMPLITUDE: 1.625
MDC IDC SET LEADCHNL RV PACING PULSEWIDTH: 0.4 ms
MDC IDC SET LEADCHNL RV SENSING SENSITIVITY: 2 mV
MDC IDC STAT BRADY AP VP PERCENT: 1 %
MDC IDC STAT BRADY AP VS PERCENT: 17 %
MDC IDC STAT BRADY RV PERCENT PACED: 2.4 %
Pulse Gen Model: 2210

## 2016-05-25 DIAGNOSIS — E1122 Type 2 diabetes mellitus with diabetic chronic kidney disease: Secondary | ICD-10-CM | POA: Diagnosis not present

## 2016-05-25 DIAGNOSIS — I10 Essential (primary) hypertension: Secondary | ICD-10-CM | POA: Diagnosis not present

## 2016-06-27 ENCOUNTER — Ambulatory Visit (INDEPENDENT_AMBULATORY_CARE_PROVIDER_SITE_OTHER): Payer: Medicare Other | Admitting: *Deleted

## 2016-06-27 DIAGNOSIS — I441 Atrioventricular block, second degree: Secondary | ICD-10-CM

## 2016-06-27 NOTE — Progress Notes (Signed)
Remote pacemaker transmission.   

## 2016-06-28 ENCOUNTER — Encounter: Payer: Self-pay | Admitting: Cardiology

## 2016-07-01 LAB — CUP PACEART REMOTE DEVICE CHECK
Battery Voltage: 2.92 V
Brady Statistic AP VS Percent: 17 %
Brady Statistic AS VP Percent: 1.5 %
Brady Statistic AS VS Percent: 81 %
Date Time Interrogation Session: 20180129073458
Implantable Lead Implant Date: 20130205
Implantable Lead Implant Date: 20130205
Implantable Lead Location: 753860
Lead Channel Impedance Value: 360 Ohm
Lead Channel Pacing Threshold Amplitude: 0.625 V
Lead Channel Pacing Threshold Amplitude: 0.75 V
Lead Channel Pacing Threshold Pulse Width: 0.4 ms
Lead Channel Sensing Intrinsic Amplitude: 12 mV
Lead Channel Setting Pacing Amplitude: 2.5 V
MDC IDC LEAD LOCATION: 753859
MDC IDC MSMT BATTERY REMAINING LONGEVITY: 95 mo
MDC IDC MSMT BATTERY REMAINING PERCENTAGE: 73 %
MDC IDC MSMT LEADCHNL RA PACING THRESHOLD PULSEWIDTH: 0.4 ms
MDC IDC MSMT LEADCHNL RA SENSING INTR AMPL: 3.1 mV
MDC IDC MSMT LEADCHNL RV IMPEDANCE VALUE: 610 Ohm
MDC IDC PG IMPLANT DT: 20130205
MDC IDC SET LEADCHNL RA PACING AMPLITUDE: 1.625
MDC IDC SET LEADCHNL RV PACING PULSEWIDTH: 0.4 ms
MDC IDC SET LEADCHNL RV SENSING SENSITIVITY: 2 mV
MDC IDC STAT BRADY AP VP PERCENT: 1 %
MDC IDC STAT BRADY RA PERCENT PACED: 16 %
MDC IDC STAT BRADY RV PERCENT PACED: 2.2 %
Pulse Gen Model: 2210
Pulse Gen Serial Number: 7308736

## 2016-08-24 DIAGNOSIS — Z6824 Body mass index (BMI) 24.0-24.9, adult: Secondary | ICD-10-CM | POA: Diagnosis not present

## 2016-08-24 DIAGNOSIS — Z0000 Encounter for general adult medical examination without abnormal findings: Secondary | ICD-10-CM | POA: Diagnosis not present

## 2016-08-24 DIAGNOSIS — Z1389 Encounter for screening for other disorder: Secondary | ICD-10-CM | POA: Diagnosis not present

## 2016-08-24 DIAGNOSIS — E1122 Type 2 diabetes mellitus with diabetic chronic kidney disease: Secondary | ICD-10-CM | POA: Diagnosis not present

## 2016-08-24 DIAGNOSIS — I10 Essential (primary) hypertension: Secondary | ICD-10-CM | POA: Diagnosis not present

## 2016-09-26 ENCOUNTER — Ambulatory Visit (INDEPENDENT_AMBULATORY_CARE_PROVIDER_SITE_OTHER): Payer: Medicare Other | Admitting: *Deleted

## 2016-09-26 DIAGNOSIS — I441 Atrioventricular block, second degree: Secondary | ICD-10-CM

## 2016-09-26 NOTE — Progress Notes (Signed)
Remote pacemaker transmission.   

## 2016-09-27 ENCOUNTER — Encounter: Payer: Self-pay | Admitting: Cardiology

## 2016-09-27 LAB — CUP PACEART REMOTE DEVICE CHECK
Battery Remaining Longevity: 95 mo
Battery Remaining Percentage: 73 %
Brady Statistic AP VP Percent: 1 %
Brady Statistic AP VS Percent: 16 %
Brady Statistic RA Percent Paced: 15 %
Brady Statistic RV Percent Paced: 1.9 %
Implantable Lead Implant Date: 20130205
Implantable Lead Location: 753859
Implantable Lead Model: 1948
Implantable Pulse Generator Implant Date: 20130205
Lead Channel Impedance Value: 360 Ohm
Lead Channel Impedance Value: 600 Ohm
Lead Channel Pacing Threshold Pulse Width: 0.4 ms
Lead Channel Sensing Intrinsic Amplitude: 2.6 mV
Lead Channel Setting Pacing Amplitude: 1.625
Lead Channel Setting Pacing Pulse Width: 0.4 ms
Lead Channel Setting Sensing Sensitivity: 2 mV
MDC IDC LEAD IMPLANT DT: 20130205
MDC IDC LEAD LOCATION: 753860
MDC IDC MSMT BATTERY VOLTAGE: 2.92 V
MDC IDC MSMT LEADCHNL RA PACING THRESHOLD AMPLITUDE: 0.625 V
MDC IDC MSMT LEADCHNL RV PACING THRESHOLD AMPLITUDE: 0.75 V
MDC IDC MSMT LEADCHNL RV PACING THRESHOLD PULSEWIDTH: 0.4 ms
MDC IDC MSMT LEADCHNL RV SENSING INTR AMPL: 10.6 mV
MDC IDC PG SERIAL: 7308736
MDC IDC SESS DTM: 20180430060621
MDC IDC SET LEADCHNL RV PACING AMPLITUDE: 2.5 V
MDC IDC STAT BRADY AS VP PERCENT: 1.3 %
MDC IDC STAT BRADY AS VS PERCENT: 83 %

## 2016-09-27 NOTE — Progress Notes (Signed)
Letter  

## 2016-10-19 DIAGNOSIS — R1111 Vomiting without nausea: Secondary | ICD-10-CM | POA: Diagnosis not present

## 2016-10-19 DIAGNOSIS — R404 Transient alteration of awareness: Secondary | ICD-10-CM | POA: Diagnosis not present

## 2016-10-19 DIAGNOSIS — Z794 Long term (current) use of insulin: Secondary | ICD-10-CM | POA: Diagnosis not present

## 2016-10-19 DIAGNOSIS — E784 Other hyperlipidemia: Secondary | ICD-10-CM | POA: Diagnosis not present

## 2016-10-19 DIAGNOSIS — Z87891 Personal history of nicotine dependence: Secondary | ICD-10-CM | POA: Diagnosis not present

## 2016-10-19 DIAGNOSIS — Z950 Presence of cardiac pacemaker: Secondary | ICD-10-CM | POA: Diagnosis not present

## 2016-10-19 DIAGNOSIS — B9681 Helicobacter pylori [H. pylori] as the cause of diseases classified elsewhere: Secondary | ICD-10-CM | POA: Diagnosis not present

## 2016-10-19 DIAGNOSIS — K922 Gastrointestinal hemorrhage, unspecified: Secondary | ICD-10-CM | POA: Diagnosis not present

## 2016-10-19 DIAGNOSIS — K8020 Calculus of gallbladder without cholecystitis without obstruction: Secondary | ICD-10-CM | POA: Diagnosis not present

## 2016-10-19 DIAGNOSIS — E119 Type 2 diabetes mellitus without complications: Secondary | ICD-10-CM | POA: Diagnosis not present

## 2016-10-19 DIAGNOSIS — N200 Calculus of kidney: Secondary | ICD-10-CM | POA: Diagnosis not present

## 2016-10-19 DIAGNOSIS — I2510 Atherosclerotic heart disease of native coronary artery without angina pectoris: Secondary | ICD-10-CM | POA: Diagnosis not present

## 2016-10-19 DIAGNOSIS — R278 Other lack of coordination: Secondary | ICD-10-CM | POA: Diagnosis not present

## 2016-10-19 DIAGNOSIS — Z79899 Other long term (current) drug therapy: Secondary | ICD-10-CM | POA: Diagnosis not present

## 2016-10-19 DIAGNOSIS — M6281 Muscle weakness (generalized): Secondary | ICD-10-CM | POA: Diagnosis not present

## 2016-10-19 DIAGNOSIS — E785 Hyperlipidemia, unspecified: Secondary | ICD-10-CM | POA: Diagnosis not present

## 2016-10-19 DIAGNOSIS — R112 Nausea with vomiting, unspecified: Secondary | ICD-10-CM | POA: Diagnosis not present

## 2016-10-19 DIAGNOSIS — R110 Nausea: Secondary | ICD-10-CM | POA: Diagnosis not present

## 2016-10-19 DIAGNOSIS — R531 Weakness: Secondary | ICD-10-CM | POA: Diagnosis not present

## 2016-10-19 DIAGNOSIS — K920 Hematemesis: Secondary | ICD-10-CM | POA: Diagnosis not present

## 2016-10-19 DIAGNOSIS — R2689 Other abnormalities of gait and mobility: Secondary | ICD-10-CM | POA: Diagnosis not present

## 2016-10-19 DIAGNOSIS — I10 Essential (primary) hypertension: Secondary | ICD-10-CM | POA: Diagnosis not present

## 2016-10-20 DIAGNOSIS — Z950 Presence of cardiac pacemaker: Secondary | ICD-10-CM | POA: Diagnosis not present

## 2016-10-20 DIAGNOSIS — Z87891 Personal history of nicotine dependence: Secondary | ICD-10-CM | POA: Diagnosis not present

## 2016-10-20 DIAGNOSIS — B9681 Helicobacter pylori [H. pylori] as the cause of diseases classified elsewhere: Secondary | ICD-10-CM | POA: Diagnosis not present

## 2016-10-20 DIAGNOSIS — Z794 Long term (current) use of insulin: Secondary | ICD-10-CM | POA: Diagnosis not present

## 2016-10-20 DIAGNOSIS — N200 Calculus of kidney: Secondary | ICD-10-CM | POA: Diagnosis not present

## 2016-10-20 DIAGNOSIS — K8020 Calculus of gallbladder without cholecystitis without obstruction: Secondary | ICD-10-CM | POA: Diagnosis not present

## 2016-10-20 DIAGNOSIS — I2510 Atherosclerotic heart disease of native coronary artery without angina pectoris: Secondary | ICD-10-CM | POA: Diagnosis not present

## 2016-10-20 DIAGNOSIS — I10 Essential (primary) hypertension: Secondary | ICD-10-CM | POA: Diagnosis not present

## 2016-10-20 DIAGNOSIS — E119 Type 2 diabetes mellitus without complications: Secondary | ICD-10-CM | POA: Diagnosis not present

## 2016-10-20 DIAGNOSIS — K920 Hematemesis: Secondary | ICD-10-CM | POA: Diagnosis not present

## 2016-10-20 DIAGNOSIS — E785 Hyperlipidemia, unspecified: Secondary | ICD-10-CM | POA: Diagnosis not present

## 2016-10-20 DIAGNOSIS — Z79899 Other long term (current) drug therapy: Secondary | ICD-10-CM | POA: Diagnosis not present

## 2016-10-21 DIAGNOSIS — K8020 Calculus of gallbladder without cholecystitis without obstruction: Secondary | ICD-10-CM | POA: Diagnosis not present

## 2016-10-21 DIAGNOSIS — B9681 Helicobacter pylori [H. pylori] as the cause of diseases classified elsewhere: Secondary | ICD-10-CM | POA: Diagnosis not present

## 2016-10-21 DIAGNOSIS — M6281 Muscle weakness (generalized): Secondary | ICD-10-CM | POA: Diagnosis not present

## 2016-10-21 DIAGNOSIS — Z794 Long term (current) use of insulin: Secondary | ICD-10-CM | POA: Diagnosis not present

## 2016-10-21 DIAGNOSIS — Z79899 Other long term (current) drug therapy: Secondary | ICD-10-CM | POA: Diagnosis not present

## 2016-10-21 DIAGNOSIS — E119 Type 2 diabetes mellitus without complications: Secondary | ICD-10-CM | POA: Diagnosis not present

## 2016-10-21 DIAGNOSIS — Z950 Presence of cardiac pacemaker: Secondary | ICD-10-CM | POA: Diagnosis not present

## 2016-10-21 DIAGNOSIS — K920 Hematemesis: Secondary | ICD-10-CM | POA: Diagnosis not present

## 2016-10-21 DIAGNOSIS — E784 Other hyperlipidemia: Secondary | ICD-10-CM | POA: Diagnosis not present

## 2016-10-21 DIAGNOSIS — R278 Other lack of coordination: Secondary | ICD-10-CM | POA: Diagnosis not present

## 2016-10-21 DIAGNOSIS — K922 Gastrointestinal hemorrhage, unspecified: Secondary | ICD-10-CM | POA: Diagnosis not present

## 2016-10-21 DIAGNOSIS — E785 Hyperlipidemia, unspecified: Secondary | ICD-10-CM | POA: Diagnosis not present

## 2016-10-21 DIAGNOSIS — R2689 Other abnormalities of gait and mobility: Secondary | ICD-10-CM | POA: Diagnosis not present

## 2016-10-21 DIAGNOSIS — K9289 Other specified diseases of the digestive system: Secondary | ICD-10-CM | POA: Diagnosis not present

## 2016-10-21 DIAGNOSIS — N200 Calculus of kidney: Secondary | ICD-10-CM | POA: Diagnosis not present

## 2016-10-21 DIAGNOSIS — Z87891 Personal history of nicotine dependence: Secondary | ICD-10-CM | POA: Diagnosis not present

## 2016-10-21 DIAGNOSIS — I10 Essential (primary) hypertension: Secondary | ICD-10-CM | POA: Diagnosis not present

## 2016-10-21 DIAGNOSIS — I2510 Atherosclerotic heart disease of native coronary artery without angina pectoris: Secondary | ICD-10-CM | POA: Diagnosis not present

## 2016-10-26 DIAGNOSIS — K9289 Other specified diseases of the digestive system: Secondary | ICD-10-CM | POA: Diagnosis not present

## 2016-10-26 DIAGNOSIS — B9681 Helicobacter pylori [H. pylori] as the cause of diseases classified elsewhere: Secondary | ICD-10-CM | POA: Diagnosis not present

## 2016-10-26 DIAGNOSIS — E119 Type 2 diabetes mellitus without complications: Secondary | ICD-10-CM | POA: Diagnosis not present

## 2016-11-27 DIAGNOSIS — K8020 Calculus of gallbladder without cholecystitis without obstruction: Secondary | ICD-10-CM | POA: Diagnosis not present

## 2016-11-27 DIAGNOSIS — I2510 Atherosclerotic heart disease of native coronary artery without angina pectoris: Secondary | ICD-10-CM | POA: Diagnosis not present

## 2016-11-27 DIAGNOSIS — Z87891 Personal history of nicotine dependence: Secondary | ICD-10-CM | POA: Diagnosis not present

## 2016-11-27 DIAGNOSIS — R278 Other lack of coordination: Secondary | ICD-10-CM | POA: Diagnosis not present

## 2016-11-27 DIAGNOSIS — E784 Other hyperlipidemia: Secondary | ICD-10-CM | POA: Diagnosis not present

## 2016-11-27 DIAGNOSIS — K922 Gastrointestinal hemorrhage, unspecified: Secondary | ICD-10-CM | POA: Diagnosis not present

## 2016-11-27 DIAGNOSIS — R2689 Other abnormalities of gait and mobility: Secondary | ICD-10-CM | POA: Diagnosis not present

## 2016-11-27 DIAGNOSIS — N200 Calculus of kidney: Secondary | ICD-10-CM | POA: Diagnosis not present

## 2016-11-27 DIAGNOSIS — E119 Type 2 diabetes mellitus without complications: Secondary | ICD-10-CM | POA: Diagnosis not present

## 2016-11-27 DIAGNOSIS — Z794 Long term (current) use of insulin: Secondary | ICD-10-CM | POA: Diagnosis not present

## 2016-11-27 DIAGNOSIS — E785 Hyperlipidemia, unspecified: Secondary | ICD-10-CM | POA: Diagnosis not present

## 2016-11-27 DIAGNOSIS — I10 Essential (primary) hypertension: Secondary | ICD-10-CM | POA: Diagnosis not present

## 2016-11-27 DIAGNOSIS — M6281 Muscle weakness (generalized): Secondary | ICD-10-CM | POA: Diagnosis not present

## 2016-11-27 DIAGNOSIS — Z950 Presence of cardiac pacemaker: Secondary | ICD-10-CM | POA: Diagnosis not present

## 2016-11-27 DIAGNOSIS — K920 Hematemesis: Secondary | ICD-10-CM | POA: Diagnosis not present

## 2016-11-27 DIAGNOSIS — Z79899 Other long term (current) drug therapy: Secondary | ICD-10-CM | POA: Diagnosis not present

## 2016-11-30 DIAGNOSIS — E784 Other hyperlipidemia: Secondary | ICD-10-CM | POA: Diagnosis not present

## 2016-11-30 DIAGNOSIS — I10 Essential (primary) hypertension: Secondary | ICD-10-CM | POA: Diagnosis not present

## 2016-11-30 DIAGNOSIS — E119 Type 2 diabetes mellitus without complications: Secondary | ICD-10-CM | POA: Diagnosis not present

## 2016-12-02 ENCOUNTER — Encounter: Payer: Medicare Other | Admitting: Internal Medicine

## 2016-12-20 ENCOUNTER — Encounter: Payer: Self-pay | Admitting: *Deleted

## 2016-12-23 ENCOUNTER — Encounter: Payer: Self-pay | Admitting: Internal Medicine

## 2016-12-23 ENCOUNTER — Ambulatory Visit (INDEPENDENT_AMBULATORY_CARE_PROVIDER_SITE_OTHER): Payer: Medicare Other | Admitting: Internal Medicine

## 2016-12-23 VITALS — BP 118/60 | HR 73 | Ht 65.0 in

## 2016-12-23 DIAGNOSIS — E119 Type 2 diabetes mellitus without complications: Secondary | ICD-10-CM

## 2016-12-23 DIAGNOSIS — Z794 Long term (current) use of insulin: Secondary | ICD-10-CM | POA: Diagnosis not present

## 2016-12-23 DIAGNOSIS — R001 Bradycardia, unspecified: Secondary | ICD-10-CM

## 2016-12-23 DIAGNOSIS — I480 Paroxysmal atrial fibrillation: Secondary | ICD-10-CM

## 2016-12-23 DIAGNOSIS — I441 Atrioventricular block, second degree: Secondary | ICD-10-CM | POA: Diagnosis not present

## 2016-12-23 LAB — CUP PACEART INCLINIC DEVICE CHECK
Battery Remaining Longevity: 102 mo
Battery Voltage: 2.92 V
Brady Statistic RA Percent Paced: 15 %
Brady Statistic RV Percent Paced: 2.9 %
Date Time Interrogation Session: 20180727152020
Implantable Lead Implant Date: 20130205
Implantable Lead Implant Date: 20130205
Implantable Lead Location: 753859
Implantable Lead Location: 753860
Implantable Lead Model: 1948
Implantable Pulse Generator Implant Date: 20130205
Lead Channel Impedance Value: 375 Ohm
Lead Channel Impedance Value: 625 Ohm
Lead Channel Pacing Threshold Amplitude: 0.75 V
Lead Channel Pacing Threshold Amplitude: 0.75 V
Lead Channel Pacing Threshold Amplitude: 0.75 V
Lead Channel Pacing Threshold Amplitude: 0.75 V
Lead Channel Pacing Threshold Pulse Width: 0.4 ms
Lead Channel Pacing Threshold Pulse Width: 0.4 ms
Lead Channel Pacing Threshold Pulse Width: 0.4 ms
Lead Channel Pacing Threshold Pulse Width: 0.4 ms
Lead Channel Sensing Intrinsic Amplitude: 12 mV
Lead Channel Sensing Intrinsic Amplitude: 3.2 mV
Lead Channel Setting Pacing Amplitude: 1.75 V
Lead Channel Setting Pacing Amplitude: 2.5 V
Lead Channel Setting Pacing Pulse Width: 0.4 ms
Lead Channel Setting Sensing Sensitivity: 2 mV
Pulse Gen Model: 2210
Pulse Gen Serial Number: 7308736

## 2016-12-23 LAB — GLUCOSE, POCT (MANUAL RESULT ENTRY): POC Glucose: 194 mg/dL — AB (ref 70–99)

## 2016-12-23 NOTE — Patient Instructions (Addendum)
Medication Instructions:   Your physician recommends that you continue on your current medications as directed. Please refer to the Current Medication list given to you today.  Labwork:  NONE  Testing/Procedures:  NONE  Follow-Up: Your physician recommends that you schedule a follow-up appointment in: 1 year. Please schedule this appointment today before leaving the office.  Any Other Special Instructions Will Be Listed Below (If Applicable).  Your next device check from home is on 03/27/17.  Please take your device monitor/equipment to the nursing facility and place it into the room where you sleep.   If you need a refill on your cardiac medications before your next appointment, please call your pharmacy.

## 2016-12-23 NOTE — Progress Notes (Signed)
PCP: Toma DeitersHasanaj, Xaje A, MD Primary Cardiologist:  Dr Patricia PesaKoneswaran  Angela Mcdaniel is a 83 y.o. female who presents today for routine electrophysiology followup.  She has had significant decline over the past year.  She has been in a nursing home since May.  Her appetite is poor.  She has incontinence.  She is very sleepy today.  Her days and nights are mixed up.  She arouses.  She has recently increased remeron which may be the cause for her deep somnolence today.  Her aughter reported on visit last year that she sleeps "all the time".  This appears to be a chronic issue.  The patient's daughter states that she does not have a will to live.  Today, she denies symptoms of palpitations, chest pain, shortness of breath,  lower extremity edema, dizziness, presyncope, or syncope.  The patient is otherwise without complaint today.   Past Medical History:  Diagnosis Date  . Aortic stenosis    Mild aortic stenosis by echo recently  . Arthritis   . Diabetes mellitus   . Essential hypertension, benign   . Gout attack    "once; several years ago"  . High cholesterol   . Neuropathy   . Paroxysmal atrial fibrillation (HCC)    detected by pacemaker interrogation  . Peripheral vascular disease (HCC)    by carotid dopplers recently with 50-69% right ICA, and less than 50% left ICA stenosis  . Second degree AV block, Mobitz type II    Symptomatic s/p St. Jude PPM 07/05/11   Past Surgical History:  Procedure Laterality Date  . CATARACT EXTRACTION W/ INTRAOCULAR LENS  IMPLANT, BILATERAL    . PACEMAKER INSERTION  07/05/11   SJM Accent DR RF pacemaker implanted by Dr Johney FrameAllred  . PERMANENT PACEMAKER INSERTION N/A 07/05/2011   Procedure: PERMANENT PACEMAKER INSERTION;  Surgeon: Hillis RangeJames Lamira Borin, MD;  [facility];  Service: Cardiovascular;  Laterality: N/A;  . REFRACTIVE SURGERY  02/2011   left eye    ROS- all systems are reviewed and negative except as per HPI above  Current Outpatient Prescriptions   Medication Sig Dispense Refill  . calcium carbonate (TUMS - DOSED IN MG ELEMENTAL CALCIUM) 500 MG chewable tablet Chew 1 tablet by mouth 2 (two) times daily.    . fenofibrate (TRICOR) 145 MG tablet Take 145 mg by mouth daily.     . insulin lispro (HUMALOG) 100 UNIT/ML injection Inject 25 Units into the skin 3 (three) times daily before meals.      . metFORMIN (GLUCOPHAGE) 1000 MG tablet Take 1,000 mg by mouth 2 (two) times daily with a meal.    . mirtazapine (REMERON) 30 MG tablet Take 30 mg by mouth at bedtime.    . pantoprazole (PROTONIX) 40 MG tablet Take 40 mg by mouth 2 (two) times daily.    . ramipril (ALTACE) 5 MG capsule Take 5 mg by mouth daily.    Marland Kitchen. VITAMIN D, CHOLECALCIFEROL, PO Take 1 capsule by mouth daily.     No current facility-administered medications for this visit.     Physical Exam: Vitals:   12/23/16 1134  BP: 118/60  Pulse: 73  SpO2: 96%  Height: 5\' 5"  (1.651 m)    GEN- The patient is elderly appearing, very sleepy but rouses Head- normocephalic, atraumatic Eyes-  Sclera clear, conjunctiva pink Ears- hearing intact Oropharynx- clear Lungs- Clear to ausculation bilaterally, normal work of breathing Chest- pacemaker pocket is well healed Heart- Regular rate and rhythm, no murmurs, rubs  or gallops, PMI not laterally displaced GI- soft, NT, ND, + BS Extremities- no clubbing, cyanosis, or edema  Pacemaker interrogation- reviewed in detail today,  See PACEART report glood glucose today 194  Assessment and Plan:  1. Second degree AV block Normal pacemaker function See Pace Art report No changes today  2. afib Very low afib burden Previously has declined and is a poor candidate for anticoagulation  3. HTN Stable No change required today  4. Lethargy May benefit from reducing remeron.  I have advised that her family contact PCP for further instructions/ management.  She has failure to thrive and is likely approaching end of life.    5. Code  status DNI/DNR  Merlin Return in 1 year to the device clinic  Hillis RangeJames Avi Archuleta MD, [facility] 12/23/2016 12:10 PM

## 2016-12-24 DIAGNOSIS — R350 Frequency of micturition: Secondary | ICD-10-CM | POA: Diagnosis not present

## 2016-12-24 DIAGNOSIS — R410 Disorientation, unspecified: Secondary | ICD-10-CM | POA: Diagnosis not present

## 2016-12-29 DIAGNOSIS — R279 Unspecified lack of coordination: Secondary | ICD-10-CM | POA: Diagnosis not present

## 2016-12-29 DIAGNOSIS — S139XXA Sprain of joints and ligaments of unspecified parts of neck, initial encounter: Secondary | ICD-10-CM | POA: Diagnosis not present

## 2016-12-29 DIAGNOSIS — E119 Type 2 diabetes mellitus without complications: Secondary | ICD-10-CM | POA: Diagnosis not present

## 2016-12-29 DIAGNOSIS — W0110XA Fall on same level from slipping, tripping and stumbling with subsequent striking against unspecified object, initial encounter: Secondary | ICD-10-CM | POA: Diagnosis not present

## 2016-12-29 DIAGNOSIS — S299XXA Unspecified injury of thorax, initial encounter: Secondary | ICD-10-CM | POA: Diagnosis not present

## 2016-12-29 DIAGNOSIS — E7800 Pure hypercholesterolemia, unspecified: Secondary | ICD-10-CM | POA: Diagnosis not present

## 2016-12-29 DIAGNOSIS — G4489 Other headache syndrome: Secondary | ICD-10-CM | POA: Diagnosis not present

## 2016-12-29 DIAGNOSIS — Z79899 Other long term (current) drug therapy: Secondary | ICD-10-CM | POA: Diagnosis not present

## 2016-12-29 DIAGNOSIS — I6200 Nontraumatic subdural hemorrhage, unspecified: Secondary | ICD-10-CM | POA: Diagnosis not present

## 2016-12-29 DIAGNOSIS — N390 Urinary tract infection, site not specified: Secondary | ICD-10-CM | POA: Diagnosis not present

## 2016-12-29 DIAGNOSIS — Z66 Do not resuscitate: Secondary | ICD-10-CM | POA: Diagnosis not present

## 2016-12-29 DIAGNOSIS — S0990XA Unspecified injury of head, initial encounter: Secondary | ICD-10-CM | POA: Diagnosis not present

## 2016-12-29 DIAGNOSIS — Z794 Long term (current) use of insulin: Secondary | ICD-10-CM | POA: Diagnosis not present

## 2016-12-29 DIAGNOSIS — S098XXA Other specified injuries of head, initial encounter: Secondary | ICD-10-CM | POA: Diagnosis not present

## 2016-12-29 DIAGNOSIS — Z743 Need for continuous supervision: Secondary | ICD-10-CM | POA: Diagnosis not present

## 2016-12-29 DIAGNOSIS — S3993XA Unspecified injury of pelvis, initial encounter: Secondary | ICD-10-CM | POA: Diagnosis not present

## 2016-12-29 DIAGNOSIS — R229 Localized swelling, mass and lump, unspecified: Secondary | ICD-10-CM | POA: Diagnosis not present

## 2017-01-09 DIAGNOSIS — M79672 Pain in left foot: Secondary | ICD-10-CM | POA: Diagnosis not present

## 2017-01-14 DIAGNOSIS — E119 Type 2 diabetes mellitus without complications: Secondary | ICD-10-CM | POA: Diagnosis not present

## 2017-01-14 DIAGNOSIS — I10 Essential (primary) hypertension: Secondary | ICD-10-CM | POA: Diagnosis not present

## 2017-01-14 DIAGNOSIS — E784 Other hyperlipidemia: Secondary | ICD-10-CM | POA: Diagnosis not present

## 2017-02-01 DIAGNOSIS — M7989 Other specified soft tissue disorders: Secondary | ICD-10-CM | POA: Diagnosis not present

## 2017-02-01 DIAGNOSIS — R52 Pain, unspecified: Secondary | ICD-10-CM | POA: Diagnosis not present

## 2017-02-01 DIAGNOSIS — M6281 Muscle weakness (generalized): Secondary | ICD-10-CM | POA: Diagnosis not present

## 2017-02-01 DIAGNOSIS — R2689 Other abnormalities of gait and mobility: Secondary | ICD-10-CM | POA: Diagnosis not present

## 2017-02-02 DIAGNOSIS — R2689 Other abnormalities of gait and mobility: Secondary | ICD-10-CM | POA: Diagnosis not present

## 2017-02-02 DIAGNOSIS — M6281 Muscle weakness (generalized): Secondary | ICD-10-CM | POA: Diagnosis not present

## 2017-02-03 DIAGNOSIS — M6281 Muscle weakness (generalized): Secondary | ICD-10-CM | POA: Diagnosis not present

## 2017-02-03 DIAGNOSIS — R2689 Other abnormalities of gait and mobility: Secondary | ICD-10-CM | POA: Diagnosis not present

## 2017-02-06 DIAGNOSIS — R2689 Other abnormalities of gait and mobility: Secondary | ICD-10-CM | POA: Diagnosis not present

## 2017-02-06 DIAGNOSIS — M6281 Muscle weakness (generalized): Secondary | ICD-10-CM | POA: Diagnosis not present

## 2017-02-07 DIAGNOSIS — R2689 Other abnormalities of gait and mobility: Secondary | ICD-10-CM | POA: Diagnosis not present

## 2017-02-07 DIAGNOSIS — M6281 Muscle weakness (generalized): Secondary | ICD-10-CM | POA: Diagnosis not present

## 2017-02-08 DIAGNOSIS — R2689 Other abnormalities of gait and mobility: Secondary | ICD-10-CM | POA: Diagnosis not present

## 2017-02-08 DIAGNOSIS — M6281 Muscle weakness (generalized): Secondary | ICD-10-CM | POA: Diagnosis not present

## 2017-02-09 DIAGNOSIS — R2689 Other abnormalities of gait and mobility: Secondary | ICD-10-CM | POA: Diagnosis not present

## 2017-02-09 DIAGNOSIS — M6281 Muscle weakness (generalized): Secondary | ICD-10-CM | POA: Diagnosis not present

## 2017-02-10 DIAGNOSIS — M6281 Muscle weakness (generalized): Secondary | ICD-10-CM | POA: Diagnosis not present

## 2017-02-10 DIAGNOSIS — R2689 Other abnormalities of gait and mobility: Secondary | ICD-10-CM | POA: Diagnosis not present

## 2017-02-14 DIAGNOSIS — R2689 Other abnormalities of gait and mobility: Secondary | ICD-10-CM | POA: Diagnosis not present

## 2017-02-14 DIAGNOSIS — M6281 Muscle weakness (generalized): Secondary | ICD-10-CM | POA: Diagnosis not present

## 2017-02-15 DIAGNOSIS — M6281 Muscle weakness (generalized): Secondary | ICD-10-CM | POA: Diagnosis not present

## 2017-02-15 DIAGNOSIS — R2689 Other abnormalities of gait and mobility: Secondary | ICD-10-CM | POA: Diagnosis not present

## 2017-02-17 DIAGNOSIS — R2689 Other abnormalities of gait and mobility: Secondary | ICD-10-CM | POA: Diagnosis not present

## 2017-02-17 DIAGNOSIS — M6281 Muscle weakness (generalized): Secondary | ICD-10-CM | POA: Diagnosis not present

## 2017-03-02 DIAGNOSIS — I10 Essential (primary) hypertension: Secondary | ICD-10-CM | POA: Diagnosis not present

## 2017-03-02 DIAGNOSIS — E119 Type 2 diabetes mellitus without complications: Secondary | ICD-10-CM | POA: Diagnosis not present

## 2017-03-02 DIAGNOSIS — E7849 Other hyperlipidemia: Secondary | ICD-10-CM | POA: Diagnosis not present

## 2017-03-22 ENCOUNTER — Encounter: Payer: Self-pay | Admitting: Vascular Surgery

## 2017-03-22 ENCOUNTER — Other Ambulatory Visit: Payer: Self-pay | Admitting: *Deleted

## 2017-03-22 ENCOUNTER — Ambulatory Visit (INDEPENDENT_AMBULATORY_CARE_PROVIDER_SITE_OTHER): Payer: Medicare Other | Admitting: Vascular Surgery

## 2017-03-22 ENCOUNTER — Encounter: Payer: Self-pay | Admitting: *Deleted

## 2017-03-22 VITALS — BP 78/50 | HR 77 | Temp 96.9°F | Resp 14 | Ht 66.0 in

## 2017-03-22 DIAGNOSIS — I739 Peripheral vascular disease, unspecified: Secondary | ICD-10-CM

## 2017-03-22 NOTE — Progress Notes (Signed)
Vitals:   03/22/17 1059  BP: (!) 85/49  Pulse: 77  Resp: 14  Temp: (!) 96.9 F (36.1 C)  SpO2: 100%  Height: 5\' 6"  (1.676 m)

## 2017-03-22 NOTE — Progress Notes (Signed)
Patient ID: Angela GiovanniMuriel O Featherston, female   DOB: 07/27/1921, 83 y.o.   MRN: [id number]  Reason for Consult: New Patient (Initial Visit) (wound on left great toe)   Referred by Toma DeitersHasanaj, Xaje A, MD  Subjective:     HPI:  Angela Mcdaniel is a 83 y.o. female with history of diabetes and high cholesterol on TriCor.  She does not take any antiplatelet or blood thinning medications.  She did have an SDH in the recent past and [facility]  She is here for evaluation of her lower externally arterial disease and also has a recent history of a DVT but has not been placed on any blood thinners.  She also does not have an IVC filter that I see record of it.  She actually has very little record for review today in office.  Per the daughter she has a history of a left great toe ulcer that has been present for a few months that she has waited for wound care center.  She does not walk but does use both legs for transferring to her wheelchair.  Daughter thinks the wound is from a wheelchair accident.  Past Medical History:  Diagnosis Date  . Aortic stenosis    Mild aortic stenosis by echo recently  . Arthritis   . Diabetes mellitus   . Essential hypertension, benign   . Gout attack    "once; several years ago"  . High cholesterol   . Neuropathy   . Paroxysmal atrial fibrillation (HCC)    detected by pacemaker interrogation  . Peripheral vascular disease (HCC)    by carotid dopplers recently with 50-69% right ICA, and less than 50% left ICA stenosis  . Second degree AV block, Mobitz type II    Symptomatic s/p St. Jude PPM 07/05/11   Family History  Problem Relation Age of Onset  . Stroke Father   . Heart attack Mother   . Heart attack Brother    Past Surgical History:  Procedure Laterality Date  . CATARACT EXTRACTION W/ INTRAOCULAR LENS  IMPLANT, BILATERAL    . PACEMAKER INSERTION  07/05/11   SJM Accent DR RF pacemaker implanted by Dr Johney FrameAllred  . PERMANENT  PACEMAKER INSERTION N/A 07/05/2011   Procedure: PERMANENT PACEMAKER INSERTION;  Surgeon: Hillis RangeJames Allred, MD;  [facility];  Service: Cardiovascular;  Laterality: N/A;  . REFRACTIVE SURGERY  02/2011   left eye    Short Social History:  Social History  Substance Use Topics  . Smoking status: Former Smoker    Packs/day: 1.00    Years: 20.00    Types: Cigarettes    Quit date: 05/30/1988  . Smokeless tobacco: Never Used  . Alcohol use No    No Known Allergies  Current Outpatient Prescriptions  Medication Sig Dispense Refill  . calcium carbonate (TUMS - DOSED IN MG ELEMENTAL CALCIUM) 500 MG chewable tablet Chew 1 tablet by mouth 2 (two) times daily.    . fenofibrate (TRICOR) 145 MG tablet Take 145 mg by mouth daily.     . insulin lispro (HUMALOG) 100 UNIT/ML injection Inject 25 Units into the skin 3 (three) times daily before meals.      . metFORMIN (GLUCOPHAGE) 1000 MG tablet Take 1,000 mg by mouth 2 (two) times daily with a meal.    . mirtazapine (REMERON) 30 MG tablet Take 30 mg by mouth at bedtime.    . pantoprazole (PROTONIX) 40 MG tablet Take 40  mg by mouth 2 (two) times daily.    . ramipril (ALTACE) 5 MG capsule Take 5 mg by mouth daily.    Marland Kitchen VITAMIN D, CHOLECALCIFEROL, PO Take 1 capsule by mouth daily.     No current facility-administered medications for this visit.     Review of Systems  Constitutional:  Constitutional negative. HENT: HENT negative.  Eyes: Eyes negative.  Respiratory: Respiratory negative.  Cardiovascular: Cardiovascular negative.  GI: Gastrointestinal negative.  Skin: Positive for wound.  Neurological: Neurological negative. Hematologic: Hematologic/lymphatic negative.  Psychiatric:       dementia       Objective:  Objective   Vitals:   03/22/17 1059 03/22/17 1105  BP: (!) 85/49 (!) 78/50  Pulse: 77 77  Resp: 14   Temp: (!) 96.9 F (36.1 C)   SpO2: 100%   Height: 5\' 6"  (1.676 m)    There is no height or weight on file to calculate  BMI.  Physical Exam  Constitutional: She appears well-developed.  HENT:  Head: Normocephalic.  Eyes: Pupils are equal, round, and reactive to light.  Neck: Normal range of motion.  Cardiovascular:  Pulses:      Femoral pulses are 1+ on the right side, and 1+ on the left side. Monophasic dp bilaterally  Pulmonary/Chest: Effort normal.  Abdominal: Soft.  Musculoskeletal: Normal range of motion. She exhibits no edema.  Neurological: She is alert.  Psychiatric: She has a normal mood and affect. Her behavior is normal. Judgment and thought content normal.    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    There is also a DVT study that demonstrated DVT in the common femoral, femoral vein and popliteal vein on the left.     Assessment/Plan:     83 year old female with a wound on her left great toe and a history of DVT on the left with no anticoagulants or antiplatelet agents.  I recommended she get Santyl to the left great toe as well as begin aspirin at least 81 mg daily if not otherwise contraindicated.  I also had a frank discussion with the daughter and it does appear that she uses both legs at least for transferring.  We will schedule her today for aortogram with possible left lower extremity intervention and if the family is not agreeable they can call to cancel.  We will also refer her to wound care center which I think will be most beneficial for her.  If the family needs to schedule a follow-up visit prior to procedure they can certainly do that in a few weeks.  Hopefully with Santyl and wound care we could potentially heal this although I told her that without improving her blood flow we may have no chance to heal it.  Daughter demonstrates good understanding.     Maeola Harman MD [facility]

## 2017-03-24 ENCOUNTER — Encounter: Payer: Self-pay | Admitting: Internal Medicine

## 2017-03-27 ENCOUNTER — Encounter: Payer: Medicare Other | Admitting: *Deleted

## 2017-03-27 ENCOUNTER — Telehealth: Payer: Self-pay | Admitting: Cardiology

## 2017-03-27 NOTE — Telephone Encounter (Signed)
Attempted to confirm remote transmission with pt. No answer and was unable to leave a message.   

## 2017-03-29 ENCOUNTER — Ambulatory Visit (HOSPITAL_COMMUNITY): Payer: Medicare Other | Admitting: Physical Therapy

## 2017-03-31 ENCOUNTER — Encounter: Payer: Self-pay | Admitting: Cardiology

## 2017-04-07 ENCOUNTER — Ambulatory Visit (HOSPITAL_COMMUNITY): Payer: Medicare Other | Attending: Internal Medicine | Admitting: Physical Therapy

## 2017-04-07 ENCOUNTER — Encounter (HOSPITAL_COMMUNITY): Payer: Self-pay | Admitting: Physical Therapy

## 2017-04-07 ENCOUNTER — Other Ambulatory Visit: Payer: Self-pay

## 2017-04-07 DIAGNOSIS — S91102A Unspecified open wound of left great toe without damage to nail, initial encounter: Secondary | ICD-10-CM | POA: Diagnosis not present

## 2017-04-07 DIAGNOSIS — S91109D Unspecified open wound of unspecified toe(s) without damage to nail, subsequent encounter: Secondary | ICD-10-CM

## 2017-04-07 NOTE — Addendum Note (Signed)
Addended by: Bella KennedyUSSELL, CYNTHIA J on: 04/07/2017 12:30 PM   Modules accepted: Orders

## 2017-04-07 NOTE — Therapy (Signed)
[facility] [address] [place], [place], [address] Phone: [phone]   Fax:  [phone]  Wound Care Evaluation  Patient Details  Name: Angela Mcdaniel MRN: [id number] Date of Birth: [birth year] Referring Provider: Perrin Smack   Encounter Date: 04/07/2017  PT End of Session - 04/07/17 1223    Visit Number  1    Number of Visits  1    Authorization Type  [employer]    PT Start Time  1115    PT Stop Time  1155    PT Time Calculation (min)  40 min    Activity Tolerance  Patient tolerated treatment well    Behavior During Therapy  [facility] for tasks assessed/performed       Past Medical History:  Diagnosis Date  . Aortic stenosis    Mild aortic stenosis by echo recently  . Arthritis   . Diabetes mellitus   . Essential hypertension, benign   . Gout attack    "once; several years ago"  . High cholesterol   . Neuropathy   . Paroxysmal atrial fibrillation (HCC)    detected by pacemaker interrogation  . Peripheral vascular disease (HCC)    by carotid dopplers recently with 50-69% right ICA, and less than 50% left ICA stenosis  . Second degree AV block, Mobitz type II    Symptomatic s/p [employer] PPM 07/05/11    Past Surgical History:  Procedure Laterality Date  . CATARACT EXTRACTION W/ INTRAOCULAR LENS  IMPLANT, BILATERAL    . PACEMAKER INSERTION  07/05/11   [employer] DR RF pacemaker implanted by Dr Johney Frame  . REFRACTIVE SURGERY  02/2011   left eye    There were no vitals filed for this visit.    [facility] PT Assessment - 04/07/17 0001      Assessment   Medical Diagnosis  Non healing Lt great toe wound    Referring Provider  Perrin Smack    Onset Date/Surgical Date  01/11/17 approximate    Next MD Visit  unsure    Prior Therapy  nursing care      Precautions   Precautions  None      Restrictions   Weight Bearing Restrictions  No      Balance Screen   Has the patient fallen in the past 6 months  No    Has the patient had a  decrease in activity level because of a fear of falling?   No    Is the patient reluctant to leave their home because of a fear of falling?   No      Prior Function   Level of Independence  -- wheelchair bound lives in a nursing facility.       Cognition   Overall Cognitive Status  History of cognitive impairments - at baseline      Wound Therapy - 04/07/17 1210    Subjective  Ms. Going's daughter states that they took her mother to the MD who prescribed santyl for the nurses to use with dressing changes and referred her mother to this facility.  She states that the wound looks a 100% better since the nurses have been using the santyl but she wanted to make the appointment here just to make sure that there is nothing more to be done.  The pateint has had a doppler which does show a thrombus in the Lt femoral vein.   An arterial study was done but did not have adequate results.  Patient and Family Stated Goals  wound to heal    Date of Onset  02/11/17    Prior Treatments  nursing care.     Pain Assessment  No/denies pain    Evaluation and Treatment Procedures Explained to Patient/Family  Yes    Evaluation and Treatment Procedures  Patient unable to consent due to mental status    Incision Properties Date First Assessed: 04/07/17 Time First Assessed: 1119 Location: Toe (Comment  which one) Location Orientation: Left Present on Admission: Yes Final Assessment Date: 04/07/17 Final Assessment Time: 1147   Dressing Type  Impregnated gauze (bismuth)    Dressing  Changed    Dressing Change Frequency  Every 3 days    Site / Wound Assessment  Granulation tissue    Drainage Amount  Minimal    Treatment  Cleansed    Wound Therapy - Clinical Statement  Angela Mcdaniel is a 83 yo female who has a non healing wound on the tip of her Lt great toe.  The wound is now 100% granulated.  I recommend that the dressing now be changed to xeroform with vasilne along the periphery as there is a slight amount of maceration .   This patient is not in need of skilled physical therapy at this time.    Factors Delaying/Impairing Wound Healing  Diabetes Mellitus;Immobility;Polypharmacy    Wound Therapy - Current Recommendations  Other (comment)    Wound Therapy - Follow Up Recommendations  Skilled nursing facility    Wound Plan  Pt will be seen one time only in this facility all further treatments can be done in the nursing facility wherer Ms. Deitrick resides.     Dressing   xeroform, 2x2 and 2" kling.              Objective measurements completed on examination: See above findings.            PT Education - 04/07/17 1222    Education provided  Yes    Education Details  Wound looks good and pt does not need skilled physical therapy services for wound care.     Person(s) Educated  Child(ren)    Methods  Explanation    Comprehension  Verbalized understanding       PT Short Term Goals - 04/07/17 1224      PT SHORT TERM GOAL #1   Title  Family to be aware that healing at age 83 will be much slower than middle aged.     Time  1    Period  Days    Status  Achieved    Target Date  04/07/17              Plan - 04/07/17 1223    Clinical Impression Statement  Pt wound is 100% granulated and should be able to be cared for by the nursing staff at the facility where she resides.    Clinical Presentation  Stable    Clinical Decision Making  Low    Rehab Potential  Good    PT Next Visit Plan  Discharge to nursing        Patient will benefit from skilled therapeutic intervention in order to improve the following deficits and impairments:  Other (comment)(nonhealing wound)  Visit Diagnosis: Open toe wound, subsequent encounter  G-Codes - 04/07/17 1225    Functional Limitation  Other PT primary    Other PT Primary Current Status ([id number])  At least 1 percent but less than 20 percent impaired,  limited or restricted    Other PT Primary Goal Status ([id number])  At least 1 percent but less than 20 percent  impaired, limited or restricted    Other PT Primary Discharge Status [phone])  At least 1 percent but less than 20 percent impaired, limited or restricted       Problem List Patient Active Problem List   Diagnosis Date Noted  . Paroxysmal atrial fibrillation (HCC) 11/02/2011  . Second degree Mobitz II AV block 07/05/2011  . Symptomatic bradycardia 06/30/2011  . Normal left ventricular systolic function 06/30/2011  . Aortic stenosis 06/30/2011  . Carotid artery disease (HCC) 06/30/2011  . Diabetes mellitus (HCC) 06/30/2011  . HTN (hypertension) 06/30/2011   Virgina Organynthia Desi Carby, PT CLT [phone] 04/07/2017, 12:26 PM  [facility] [address] [place] , [place], [address] Phone: [phone]   Fax:  [phone]  Name: John GiovanniMuriel O Braun MRN: [id number] Date of Birth: 03/21/1922

## 2017-04-12 DIAGNOSIS — R1311 Dysphagia, oral phase: Secondary | ICD-10-CM | POA: Diagnosis not present

## 2017-04-13 ENCOUNTER — Other Ambulatory Visit: Payer: Self-pay | Admitting: *Deleted

## 2017-04-13 DIAGNOSIS — R1311 Dysphagia, oral phase: Secondary | ICD-10-CM | POA: Diagnosis not present

## 2017-04-18 DIAGNOSIS — R1311 Dysphagia, oral phase: Secondary | ICD-10-CM | POA: Diagnosis not present

## 2017-04-19 DIAGNOSIS — R1311 Dysphagia, oral phase: Secondary | ICD-10-CM | POA: Diagnosis not present

## 2017-04-20 DIAGNOSIS — E7849 Other hyperlipidemia: Secondary | ICD-10-CM | POA: Diagnosis not present

## 2017-04-20 DIAGNOSIS — I10 Essential (primary) hypertension: Secondary | ICD-10-CM | POA: Diagnosis not present

## 2017-04-20 DIAGNOSIS — E119 Type 2 diabetes mellitus without complications: Secondary | ICD-10-CM | POA: Diagnosis not present

## 2017-04-21 DIAGNOSIS — R1311 Dysphagia, oral phase: Secondary | ICD-10-CM | POA: Diagnosis not present

## 2017-04-25 DIAGNOSIS — R1311 Dysphagia, oral phase: Secondary | ICD-10-CM | POA: Diagnosis not present

## 2017-04-26 ENCOUNTER — Ambulatory Visit (HOSPITAL_COMMUNITY): Admission: RE | Admit: 2017-04-26 | Payer: Medicare Other | Source: Ambulatory Visit | Admitting: Vascular Surgery

## 2017-04-26 ENCOUNTER — Encounter (HOSPITAL_COMMUNITY): Admission: RE | Payer: Self-pay | Source: Ambulatory Visit

## 2017-04-26 DIAGNOSIS — R1311 Dysphagia, oral phase: Secondary | ICD-10-CM | POA: Diagnosis not present

## 2017-04-26 SURGERY — ABDOMINAL AORTOGRAM W/LOWER EXTREMITY
Anesthesia: LOCAL

## 2017-04-27 DIAGNOSIS — R1311 Dysphagia, oral phase: Secondary | ICD-10-CM | POA: Diagnosis not present

## 2017-05-02 DIAGNOSIS — R1311 Dysphagia, oral phase: Secondary | ICD-10-CM | POA: Diagnosis not present

## 2017-05-30 DEATH — deceased

## 2017-12-29 ENCOUNTER — Encounter: Payer: Medicare Other | Admitting: Internal Medicine
